# Patient Record
Sex: Female | Born: 1995 | Race: White | Hispanic: No | Marital: Single | Smoking: Never smoker
Health system: Southern US, Community
[De-identification: ages and names within clinical notes are randomized; demographics above are authoritative.]

## PROBLEM LIST (undated history)

## (undated) DIAGNOSIS — D649 Anemia, unspecified: Secondary | ICD-10-CM

## (undated) DIAGNOSIS — B019 Varicella without complication: Secondary | ICD-10-CM

## (undated) DIAGNOSIS — N946 Dysmenorrhea, unspecified: Secondary | ICD-10-CM

## (undated) DIAGNOSIS — N939 Abnormal uterine and vaginal bleeding, unspecified: Secondary | ICD-10-CM

## (undated) DIAGNOSIS — N390 Urinary tract infection, site not specified: Secondary | ICD-10-CM

## (undated) DIAGNOSIS — B029 Zoster without complications: Secondary | ICD-10-CM

## (undated) HISTORY — DX: Anemia, unspecified: D64.9

## (undated) HISTORY — DX: Dysmenorrhea, unspecified: N94.6

## (undated) HISTORY — DX: Urinary tract infection, site not specified: N39.0

## (undated) HISTORY — DX: Varicella without complication: B01.9

## (undated) HISTORY — DX: Abnormal uterine and vaginal bleeding, unspecified: N93.9

## (undated) HISTORY — DX: Zoster without complications: B02.9

---
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
## 2014-04-22 ENCOUNTER — Ambulatory Visit (INDEPENDENT_AMBULATORY_CARE_PROVIDER_SITE_OTHER): Payer: BC Managed Care – PPO | Admitting: Certified Nurse Midwife

## 2014-04-22 ENCOUNTER — Encounter: Payer: Self-pay | Admitting: Certified Nurse Midwife

## 2014-04-22 VITALS — BP 90/60 | HR 64 | Resp 16 | Ht 68.25 in | Wt 132.0 lb

## 2014-04-22 DIAGNOSIS — Z01419 Encounter for gynecological examination (general) (routine) without abnormal findings: Secondary | ICD-10-CM

## 2014-04-22 DIAGNOSIS — N925 Other specified irregular menstruation: Secondary | ICD-10-CM

## 2014-04-22 DIAGNOSIS — N938 Other specified abnormal uterine and vaginal bleeding: Secondary | ICD-10-CM

## 2014-04-22 DIAGNOSIS — Z0000 Encounter for general adult medical examination without abnormal findings: Secondary | ICD-10-CM

## 2014-04-22 DIAGNOSIS — N949 Unspecified condition associated with female genital organs and menstrual cycle: Secondary | ICD-10-CM

## 2014-04-22 LAB — TSH: TSH: 1.241 u[IU]/mL (ref 0.350–4.500)

## 2014-04-22 LAB — POCT URINALYSIS DIPSTICK
Bilirubin, UA: NEGATIVE
Blood, UA: NEGATIVE
Glucose, UA: NEGATIVE
Ketones, UA: NEGATIVE
Nitrite, UA: NEGATIVE
Protein, UA: NEGATIVE
Urobilinogen, UA: NEGATIVE
pH, UA: 5

## 2014-04-22 NOTE — Patient Instructions (Addendum)
General topics  Next pap or exam is  due in 1 year Take a Women's multivitamin Take 1200 mg. of calcium daily - prefer dietary If any concerns in interim to call back  Breast Self-Awareness Practicing breast self-awareness may pick up problems early, prevent significant medical complications, and possibly save your life. By practicing breast self-awareness, you can become familiar with how your breasts look and feel and if your breasts are changing. This allows you to notice changes early. It can also offer you some reassurance that your breast health is good. One way to learn what is normal for your breasts and whether your breasts are changing is to do a breast self-exam. If you find a lump or something that was not present in the past, it is best to contact your caregiver right away. Other findings that should be evaluated by your caregiver include nipple discharge, especially if it is bloody; skin changes or reddening; areas where the skin seems to be pulled in (retracted); or new lumps and bumps. Breast pain is seldom associated with cancer (malignancy), but should also be evaluated by a caregiver. BREAST SELF-EXAM The best time to examine your breasts is 5 7 days after your menstrual period is over.  ExitCare Patient Information 2013 [employer].   Exercise to Stay Healthy Exercise helps you become and stay healthy. EXERCISE IDEAS AND TIPS Choose exercises that:  You enjoy.  Fit into your day. You do not need to exercise really hard to be healthy. You can do exercises at a slow or medium level and stay healthy. You can:  Stretch before and after working out.  Try yoga, Pilates, or tai chi.  Lift weights.  Walk fast, swim, jog, run, climb stairs, bicycle, dance, or rollerskate.  Take aerobic classes. Exercises that burn about 150 calories:  Running 1  miles in 15 minutes.  Playing volleyball for 45 to 60 minutes.  Washing and waxing a car for 45 to 60  minutes.  Playing touch football for 45 minutes.  Walking 1  miles in 35 minutes.  Pushing a stroller 1  miles in 30 minutes.  Playing basketball for 30 minutes.  Raking leaves for 30 minutes.  Bicycling 5 miles in 30 minutes.  Walking 2 miles in 30 minutes.  Dancing for 30 minutes.  Shoveling snow for 15 minutes.  Swimming laps for 20 minutes.  Walking up stairs for 15 minutes.  Bicycling 4 miles in 15 minutes.  Gardening for 30 to 45 minutes.  Jumping rope for 15 minutes.  Washing windows or floors for 45 to 60 minutes. Document Released: 11/11/2010 Document Revised: 01/01/2012 Document Reviewed: 11/11/2010 Exit[employer] Patient Information 2013 [employer].   Other topics ( that may be useful information):    Sexually Transmitted Disease Sexually transmitted disease (STD) refers to any infection that is passed from person to person during sexual activity. This may happen by way of saliva, semen, blood, vaginal mucus, or urine. Common STDs include:  Gonorrhea.  Chlamydia.  Syphilis.  HIV/AIDS.  Genital herpes.  Hepatitis B and C.  Trichomonas.  Human papillomavirus (HPV).  Pubic lice. CAUSES  An STD may be spread by bacteria, virus, or parasite. A person can get an STD by:  Sexual intercourse with an infected person.  Sharing sex toys with an infected person.  Sharing needles with an infected person.  Having intimate contact with the genitals, mouth, or rectal areas of an infected person. SYMPTOMS  Some people may not have any symptoms, but   they can still pass the infection to others. Different STDs have different symptoms. Symptoms include:  Painful or bloody urination.  Pain in the pelvis, abdomen, vagina, anus, throat, or eyes.  Skin rash, itching, irritation, growths, or sores (lesions). These usually occur in the genital or anal area.  Abnormal vaginal discharge.  Penile discharge in men.  Soft, flesh-colored skin growths in the  genital or anal area.  Fever.  Pain or bleeding during sexual intercourse.  Swollen glands in the groin area.  Yellow skin and eyes (jaundice). This is seen with hepatitis. DIAGNOSIS  To make a diagnosis, your caregiver may:  Take a medical history.  Perform a physical exam.  Take a specimen (culture) to be examined.  Examine a sample of discharge under a microscope.  Perform blood test TREATMENT   Chlamydia, gonorrhea, trichomonas, and syphilis can be cured with antibiotic medicine.  Genital herpes, hepatitis, and HIV can be treated, but not cured, with prescribed medicines. The medicines will lessen the symptoms.  Genital warts from HPV can be treated with medicine or by freezing, burning (electrocautery), or surgery. Warts may come back.  HPV is a virus and cannot be cured with medicine or surgery.However, abnormal areas may be followed very closely by your caregiver and may be removed from the cervix, vagina, or vulva through office procedures or surgery. If your diagnosis is confirmed, your recent sexual partners need treatment. This is true even if they are symptom-free or have a negative culture or evaluation. They should not have sex until their caregiver says it is okay. HOME CARE INSTRUCTIONS  All sexual partners should be informed, tested, and treated for all STDs.  Take your antibiotics as directed. Finish them even if you start to feel better.  Only take over-the-counter or prescription medicines for pain, discomfort, or fever as directed by your caregiver.  Rest.  Eat a balanced diet and drink enough fluids to keep your urine clear or pale yellow.  Do not have sex until treatment is completed and you have followed up with your caregiver. STDs should be checked after treatment.  Keep all follow-up appointments, Pap tests, and blood tests as directed by your caregiver.  Only use latex condoms and water-soluble lubricants during sexual activity. Do not use  petroleum jelly or oils.  Avoid alcohol and illegal drugs.  Get vaccinated for HPV and hepatitis. If you have not received these vaccines in the past, talk to your caregiver about whether one or both might be right for you.  Avoid risky sex practices that can break the skin. The only way to avoid getting an STD is to avoid all sexual activity.Latex condoms and dental dams (for oral sex) will help lessen the risk of getting an STD, but will not completely eliminate the risk. SEEK MEDICAL CARE IF:   You have a fever.  You have any new or worsening symptoms. Document Released: 12/30/2002 Document Revised: 01/01/2012 Document Reviewed: 01/06/2011 [facility] Patient Information 2013 [place].    Domestic Abuse You are being battered or abused if someone close to you hits, pushes, or physically hurts you in any way. You also are being abused if you are forced into activities. You are being sexually abused if you are forced to have sexual contact of any kind. You are being emotionally abused if you are made to feel worthless or if you are constantly threatened. It is important to remember that help is available. No one has the right to abuse you. PREVENTION OF FURTHER  ABUSE  Learn the warning signs of danger. This varies with situations but may include: the use of alcohol, threats, isolation from friends and family, or forced sexual contact. Leave if you feel that violence is going to occur.  If you are attacked or beaten, report it to the police so the abuse is documented. You do not have to press charges. The police can protect you while you or the attackers are leaving. Get the officer's name and badge number and a copy of the report.  Find someone you can trust and tell them what is happening to you: your caregiver, a nurse, clergy member, close friend or family member. Feeling ashamed is natural, but remember that you have done nothing wrong. No one deserves abuse. Document Released:  10/06/2000 Document Revised: 01/01/2012 Document Reviewed: 12/15/2010 ExitCare Patient Information 2013 [employer].    How Much is Too Much Alcohol? Drinking too much alcohol can cause injury, accidents, and health problems. These types of problems can include:   Car crashes.  Falls.  Family fighting (domestic violence).  Drowning.  Fights.  Injuries.  Burns.  Damage to certain organs.  Having a baby with birth defects. ONE DRINK CAN BE TOO MUCH WHEN YOU ARE:  Working.  Pregnant or breastfeeding.  Taking medicines. Ask your doctor.  Driving or planning to drive. If you or someone you know has a drinking problem, get help from a doctor.  Document Released: 08/05/2009 Document Revised: 01/01/2012 Document Reviewed: 08/05/2009 ExitCare Patient Information 2013 [employer].   Smoking Hazards Smoking cigarettes is extremely bad for your health. Tobacco smoke has over 200 known poisons in it. There are over 60 chemicals in tobacco smoke that cause cancer. Some of the chemicals found in cigarette smoke include:   Cyanide.  Benzene.  Formaldehyde.  Methanol (wood alcohol).  Acetylene (fuel used in welding torches).  Ammonia. Cigarette smoke also contains the poisonous gases nitrogen oxide and carbon monoxide.  Cigarette smokers have an increased risk of many serious medical problems and Smoking causes approximately:  90% of all lung cancer deaths in men.  80% of all lung cancer deaths in women.  90% of deaths from chronic obstructive lung disease. Compared with nonsmokers, smoking increases the risk of:  Coronary heart disease by 2 to 4 times.  Stroke by 2 to 4 times.  Men developing lung cancer by 23 times.  Women developing lung cancer by 13 times.  Dying from chronic obstructive lung diseases by 12 times.  . Smoking is the most preventable cause of death and disease in our society.  WHY IS SMOKING ADDICTIVE?  Nicotine is the chemical  agent in tobacco that is capable of causing addiction or dependence.  When you smoke and inhale, nicotine is absorbed rapidly into the bloodstream through your lungs. Nicotine absorbed through the lungs is capable of creating a powerful addiction. Both inhaled and non-inhaled nicotine may be addictive.  Addiction studies of cigarettes and spit tobacco show that addiction to nicotine occurs mainly during the teen years, when young people begin using tobacco products. WHAT ARE THE BENEFITS OF QUITTING?  There are many health benefits to quitting smoking.   Likelihood of developing cancer and heart disease decreases. Health improvements are seen almost immediately.  Blood pressure, pulse rate, and breathing patterns start returning to normal soon after quitting. QUITTING SMOKING   American Lung Association - 1-800-LUNGUSA  American Cancer Society - 1-800-ACS-2345 Document Released: 11/16/2004 Document Revised: 01/01/2012 Document Reviewed: 07/21/2009 ExitCare Patient Information 2013 ExitCare[employer]   LL[employer].   Stress Management Stress is a state of physical or mental tension that often results from changes in your life or normal routine. Some common causes of stress are:  Death of a loved one.  Injuries or severe illnesses.  Getting fired or changing jobs.  Moving into a new home. Other causes may be:  Sexual problems.  Business or financial losses.  Taking on a large debt.  Regular conflict with someone at home or at work.  Constant tiredness from lack of sleep. It is not just bad things that are stressful. It may be stressful to:  Win the lottery.  Get married.  Buy a new car. The amount of stress that can be easily tolerated varies from person to person. Changes generally cause stress, regardless of the types of change. Too much stress can affect your health. It may lead to physical or emotional problems. Too little stress (boredom) may also become stressful. SUGGESTIONS TO  REDUCE STRESS:  Talk things over with your family and friends. It often is helpful to share your concerns and worries. If you feel your problem is serious, you may want to get help from a professional counselor.  Consider your problems one at a time instead of lumping them all together. Trying to take care of everything at once may seem impossible. List all the things you need to do and then start with the most important one. Set a goal to accomplish 2 or 3 things each day. If you expect to do too many in a single day you will naturally fail, causing you to feel even more stressed.  Do not use alcohol or drugs to relieve stress. Although you may feel better for a short time, they do not remove the problems that caused the stress. They can also be habit forming.  Exercise regularly - at least 3 times per week. Physical exercise can help to relieve that "uptight" feeling and will relax you.  The shortest distance between despair and hope is often a good night's sleep.  Go to bed and get up on time allowing yourself time for appointments without being rushed.  Take a short "time-out" period from any stressful situation that occurs during the day. Close your eyes and take some deep breaths. Starting with the muscles in your face, tense them, hold it for a few seconds, then relax. Repeat this with the muscles in your neck, shoulders, hand, stomach, back and legs.  Take good care of yourself. Eat a balanced diet and get plenty of rest.  Schedule time for having fun. Take a break from your daily routine to relax. HOME CARE INSTRUCTIONS   Call if you feel overwhelmed by your problems and feel you can no longer manage them on your own.  Return immediately if you feel like hurting yourself or someone else. Document Released: 04/04/2001 Document Revised: 01/01/2012 Document Reviewed: 11/25/2007 [facility] Patient Information 2013 [place].  Oral Contraception Information Oral contraceptive  pills (OCPs) are medicines taken to prevent pregnancy. OCPs work by preventing the ovaries from releasing eggs. The hormones in OCPs also cause the cervical mucus to thicken, preventing the sperm from entering the uterus. The hormones also cause the uterine lining to become thin, not allowing a fertilized egg to attach to the inside of the uterus. OCPs are highly effective when taken exactly as prescribed. However, OCPs do not prevent sexually transmitted diseases (STDs). Safe sex practices, such as using condoms along with the pill, can help prevent STDs.  Before taking the pill, you may have a physical exam and Pap test. Your health care provider may order blood tests. The health care provider will make sure you are a good candidate for oral contraception. Discuss with your health care provider the possible side effects of the OCP you may be prescribed. When starting an OCP, it can take 2 to 3 months for the body to adjust to the changes in hormone levels in your body.  TYPES OF ORAL CONTRACEPTION  The combination pill--This pill contains estrogen and progestin (synthetic progesterone) hormones. The combination pill comes in 21-day, 28-day, or 91-day packs. Some types of combination pills are meant to be taken continuously (365-day pills). With 21-day packs, you do not take pills for 7 days after the last pill. With 28-day packs, the pill is taken every day. The last 7 pills are without hormones. Certain types of pills have more than 21 hormone-containing pills. With 91-day packs, the first 84 pills contain both hormones, and the last 7 pills contain no hormones or contain estrogen only.  The minipill--This pill contains the progesterone hormone only. The pill is taken every day continuously. It is very important to take the pill at the same time each day. The minipill comes in packs of 28 pills. All 28 pills contain the hormone.  ADVANTAGES OF ORAL CONTRACEPTIVE PILLS  Decreases premenstrual symptoms.    Treats menstrual period cramps.   Regulates the menstrual cycle.   Decreases a heavy menstrual flow.   May treatacne, depending on the type of pill.   Treats abnormal uterine bleeding.   Treats polycystic ovarian syndrome.   Treats endometriosis.   Can be used as emergency contraception.  THINGS THAT CAN MAKE ORAL CONTRACEPTIVE PILLS LESS EFFECTIVE OCPs can be less effective if:   You forget to take the pill at the same time every day.   You have a stomach or intestinal disease that lessens the absorption of the pill.   You take OCPs with other medicines that make OCPs less effective, such as antibiotics, certain HIV medicines, and some seizure medicines.   You take expired OCPs.   You forget to restart the pill on day 7, when using the packs of 21 pills.  RISKS ASSOCIATED WITH ORAL CONTRACEPTIVE PILLS  Oral contraceptive pills can sometimes cause side effects, such as:  Headache.  Nausea.  Breast tenderness.  Irregular bleeding or spotting. Combination pills are also associated with a small increased risk of:  Blood clots.  Heart attack.  Stroke. Document Released: 12/30/2002 Document Revised: 07/30/2013 Document Reviewed: 03/30/2013 [facility] Patient Information 2015 [place], [place]. This information is not intended to replace advice given to you by your health care provider. Make sure you discuss any questions you have with your health care provider.   Sexually Transmitted Disease A sexually transmitted disease (STD) is a disease or infection that may be passed (transmitted) from person to person, usually during sexual activity. This may happen by way of saliva, semen, blood, vaginal mucus, or urine. Common STDs include:   Gonorrhea.   Chlamydia.   Syphilis.   HIV and AIDS.   Genital herpes.   Hepatitis B and C.   Trichomonas.   Human papillomavirus (HPV).   Pubic lice.   Scabies.  Mites.  Bacterial  vaginosis. WHAT ARE CAUSES OF STDs? An STD may be caused by bacteria, a virus, or parasites. STDs are often transmitted during sexual activity if one person is infected. However, they may also be transmitted through nonsexual  means. STDs may be transmitted after:   Sexual intercourse with an infected person.   Sharing sex toys with an infected person.   Sharing needles with an infected person or using unclean piercing or tattoo needles.  Having intimate contact with the genitals, mouth, or rectal areas of an infected person.   Exposure to infected fluids during birth. WHAT ARE THE SIGNS AND SYMPTOMS OF STDs? Different STDs have different symptoms. Some people may not have any symptoms. If symptoms are present, they may include:   Painful or bloody urination.   Pain in the pelvis, abdomen, vagina, anus, throat, or eyes.   A skin rash, itching, or irritation.  Growths, ulcerations, blisters, or sores in the genital and anal areas.  Abnormal vaginal discharge with or without bad odor.   Penile discharge in men.   Fever.   Pain or bleeding during sexual intercourse.   Swollen glands in the groin area.   Yellow skin and eyes (jaundice). This is seen with hepatitis.   Swollen testicles.  Infertility.  Sores and blisters in the mouth. HOW ARE STDs DIAGNOSED? To make a diagnosis, your health care provider may:   Take a medical history.   Perform a physical exam.   Take a sample of any discharge to examine.  Swab the throat, cervix, opening to the penis, rectum, or vagina for testing.  Test a sample of your first morning urine.   Perform blood tests.   Perform a Pap test, if this applies.   Perform a colposcopy.   Perform a laparoscopy.  HOW ARE STDs TREATED? Treatment depends on the STD. Some STDs may be treated but not cured.   Chlamydia, gonorrhea, trichomonas, and syphilis can be cured with antibiotic medicine.   Genital herpes,  hepatitis, and HIV can be treated, but not cured, with prescribed medicines. The medicines lessen symptoms.   Genital warts from HPV can be treated with medicine or by freezing, burning (electrocautery), or surgery. Warts may come back.   HPV cannot be cured with medicine or surgery. However, abnormal areas may be removed from the cervix, vagina, or vulva.   If your diagnosis is confirmed, your recent sexual partners need treatment. This is true even if they are symptom-free or have a negative culture or evaluation. They should not have sex until their health care providers say it is okay. HOW CAN I REDUCE MY RISK OF GETTING AN STD? Take these steps to reduce your risk of getting an STD:  Use latex condoms, dental dams, and water-soluble lubricants during sexual activity. Do not use petroleum jelly or oils.  Avoid having multiple sex partners.  Do not have sex with someone who has other sex partners.  Do not have sex with anyone you do not know or who is at high risk for an STD.  Avoid risky sex practices that can break your skin.  Do not have sex if you have open sores on your mouth or skin.  Avoid drinking too much alcohol or taking illegal drugs. Alcohol and drugs can affect your judgment and put you in a vulnerable position.  Avoid engaging in oral and anal sex acts.  Get vaccinated for HPV and hepatitis. If you have not received these vaccines in the past, talk to your health care provider about whether one or both might be right for you.   If you are at risk of being infected with HIV, it is recommended that you take a prescription medicine daily to prevent HIV infection.  This is called pre-exposure prophylaxis (PrEP). You are considered at risk if:  You are a man who has sex with other men (MSM).  You are a heterosexual man or woman and are sexually active with more than one partner.  You take drugs by injection.  You are sexually active with a partner who has  HIV.  Talk with your health care provider about whether you are at high risk of being infected with HIV. If you choose to begin PrEP, you should first be tested for HIV. You should then be tested every 3 months for as long as you are taking PrEP.  WHAT SHOULD I DO IF I THINK I HAVE AN STD?  See your health care provider.   Tell your sexual partner(s). They should be tested and treated for any STDs.  Do not have sex until your health care provider says it is okay. WHEN SHOULD I GET IMMEDIATE MEDICAL CARE? Contact your health care provider right away if:   You have severe abdominal pain.  You are a man and notice swelling or pain in your testicles.  You are a woman and notice swelling or pain in your vagina. Document Released: 12/30/2002 Document Revised: 10/14/2013 Document Reviewed: 04/29/2013 [facility] Patient Information 2015 [place], [place]. This information is not intended to replace advice given to you by your health care provider. Make sure you discuss any questions you have with your health care provider.  Great to meet you today. Let me know if you have any other questions. Debbi

## 2014-04-22 NOTE — Progress Notes (Signed)
18 y.o. G0P0000 Single Caucasian Fe here to establish gyn care and  for annual exam. Patient is here with problem of short cycles which are heavy. Patient being treated by PCP for anemia, last Hgb was 9.0 per patient and mom. Cycles are usually every 15 to 21 days and heavy 4-5 duration, which require tampon and pads at times. Mother with patient at patient's request for conversation only. Patient desires privacy for exam. Patient has never had Gyn exam and denies being sexually active ever. Denies vaginal symptoms or other problems.  No other health issues today.  Mother was tested for Factor V Leiden and was negative.  Mother has been on pills in the past and did not have any trouble with them.  Not on them now.   Patient's last menstrual period was 04/02/2014.          Sexually active: No.  The current method of family planning is abstinence.    Exercising: Yes.    swimming & running Smoker:  no  Health Maintenance: Pap:  none MMG:  none Colonoscopy:  none BMD:   none TDaP: 2014 Labs: Poct urine-tr wbc Self breast exam: not done   reports that she has never smoked. She does not have any smokeless tobacco history on file. She reports that she drinks about 1.5 ounces of alcohol per week. She reports that she does not use illicit drugs.  Past Medical History  Diagnosis Date  . Abnormal uterine bleeding     frequent cycles  . Anemia   . Dysmenorrhea   . Chicken pox     born with chicken pox  . Shingles     age 89  . Chronic UTI     History reviewed. No pertinent past surgical history.  Current Outpatient Prescriptions  Medication Sig Dispense Refill  . ferrous sulfate 325 (65 FE) MG EC tablet Take 325 mg by mouth 3 (three) times daily with meals.       No current facility-administered medications for this visit.    Family History  Problem Relation Age of Onset  . Hypertension Father   . Hyperlipidemia Father   . Hypertension Maternal Grandmother   . Cancer Maternal  Grandfather     prostate  . Hypertension Paternal Grandmother   . Hypertension Paternal Grandfather     ROS:  Pertinent items are noted in HPI.  Otherwise, a comprehensive ROS was negative.  Exam:   BP 90/60  Pulse 64  Resp 16  Ht 5' 8.25" (1.734 m)  Wt 132 lb (59.875 kg)  BMI 19.91 kg/m2  LMP 04/02/2014 Height: 5' 8.25" (173.4 cm)  Ht Readings from Last 3 Encounters:  04/22/14 5' 8.25" (1.734 m) (94%*, Z = 1.56)   * Growth percentiles are based on CDC 2-20 Years data.    General appearance: alert, cooperative and appears stated age Head: Normocephalic, without obvious abnormality, atraumatic Neck: no adenopathy, supple, symmetrical, trachea midline and thyroid normal to inspection and palpation and non-palpable Lungs: clear to auscultation bilaterally Breasts: normal appearance, no masses or tenderness, No nipple retraction or dimpling, No nipple discharge or bleeding, No axillary or supraclavicular adenopathy Heart: regular rate and rhythm Abdomen: soft, non-tender; no masses,  no organomegaly Extremities: extremities normal, atraumatic, no cyanosis or edema Skin: Skin color, texture, turgor normal. No rashes or lesions Lymph nodes: Cervical, supraclavicular, and axillary nodes normal. No abnormal inguinal nodes palpated Neurologic: Grossly normal   Pelvic: External genitalia:  no lesions  Urethra:  normal appearing urethra with no masses, tenderness or lesions              Bartholin's and Skene's: normal                 Vagina: normal appearing vagina with normal color and discharge, no lesions              Cervix: normal, non tender              Pap taken: No. Bimanual Exam:  Uterus:  normal size, contour, position, consistency, mobility, non-tender and anteverted              Adnexa: normal adnexa and no mass, fullness, tenderness               Rectovaginal: Confirms               Anus:   Deferred  A:  Well Woman with normal exam  History of  Dysmenorrhea with menorrhagia and subsequent anemia desires cycle control with OCP  Anemia under PCP management, on Rx  P:   Reviewed health and wellness pertinent to exam  Discussed risks, benefits of OCP, expectations with use, bleeding profile and consistency of use for best outcome. Also discussed thyroid role in cycle changes, patient's MGM had some issue with OCP years ago with numbness, unknown etiology. Will await labs and then decide if patient would like to start on OCP. Mother present during this part of the conversation,per patient request.  Labs: TSH, Factor 5 Leiden  Pap smear not taken today  counseled on breast self exam, STD prevention, HIV risk factors and prevention, adequate intake of calcium and vitamin D, diet and exercise   Rv as above return annually or prn  An After Visit Summary was printed and given to the patient.

## 2014-04-25 LAB — FACTOR 5 LEIDEN

## 2014-04-30 ENCOUNTER — Telehealth: Payer: Self-pay | Admitting: Certified Nurse Midwife

## 2014-04-30 NOTE — Telephone Encounter (Signed)
Routing to Verner Choleborah S. Leonard CNM for review and advice re Ferrous Sulfate rx and new start ocp.

## 2014-04-30 NOTE — Telephone Encounter (Signed)
This is appropriate response to iron supplement. If patient wanted to start OCP she can. This should not be a problem.

## 2014-04-30 NOTE — Telephone Encounter (Signed)
Patient's mom calling on behalf of patient with HGB levels from Dr. Duanne Guessewey: 03/26/14=9, 04/16/14=10.6. She wants to know if this is good enough progress on the iron supplement she is taking 3 x daily. Also, mom is inquiring about how this will effect the decision about birth control?

## 2014-05-01 ENCOUNTER — Telehealth: Payer: Self-pay | Admitting: Certified Nurse Midwife

## 2014-05-01 MED ORDER — NORETHIN-ETH ESTRAD-FE BIPHAS 1 MG-10 MCG / 10 MCG PO TABS: 1.0000 | ORAL_TABLET | Freq: Every day | ORAL | Status: DC

## 2014-05-01 NOTE — Telephone Encounter (Signed)
Routed to provider for review, encounter closed.  

## 2014-05-01 NOTE — Telephone Encounter (Signed)
Spoke with patient's mother Judy Carter. Okay per ROI. She states that patient would like to start birth control now. Aware to start pack with the first day of menses. Requesting rx [facility] [address]. Advised would send a message to Verner Choleborah S. Leonard CNM and let her know and that we would get rx sent in today for patient. Mother is agreeable. Would like rx sent for 90 days as patient will be returning to college soon and this will be easier. Three month recheck scheduled for Oct 2nd at 1:15pm with Verner Choleborah S. Leonard CNM. Agreeable to date and time.  Verner Choleborah S. Leonard CNM what kind of OCP do you recommend for this patient to start on? I will be happy to place order. Thank you.

## 2014-05-01 NOTE — Telephone Encounter (Signed)
Order placed for Loloestrin Fe #3 with 3RF to pharmacy of choice.  Routing to provider for final review. Patient agreeable to disposition. Will close encounter ;

## 2014-05-01 NOTE — Telephone Encounter (Signed)
05/01/14 #3 packs with 3 refills was sent to [facility]  S/w patient and notified her that rx has been sent.

## 2014-05-01 NOTE — Telephone Encounter (Signed)
Loloestrin Fe is what we discussed.

## 2014-05-01 NOTE — Telephone Encounter (Signed)
Pt need a Rx for loloestrin birth control [facility] to [facility] at [phone].

## 2014-05-04 NOTE — Progress Notes (Signed)
Reviewed personally.  M. Suzanne Oriel Ojo, MD.  

## 2014-05-04 NOTE — Progress Notes (Signed)
Reviewed personally.  M. Suzanne Roise Emert, MD.  

## 2014-07-24 ENCOUNTER — Ambulatory Visit (INDEPENDENT_AMBULATORY_CARE_PROVIDER_SITE_OTHER): Payer: BC Managed Care – PPO | Admitting: Certified Nurse Midwife

## 2014-07-24 ENCOUNTER — Encounter: Payer: Self-pay | Admitting: Certified Nurse Midwife

## 2014-07-24 VITALS — BP 90/60 | HR 64 | Resp 16 | Ht 68.25 in | Wt 136.0 lb

## 2014-07-24 DIAGNOSIS — Z3041 Encounter for surveillance of contraceptive pills: Secondary | ICD-10-CM

## 2014-07-24 DIAGNOSIS — N920 Excessive and frequent menstruation with regular cycle: Secondary | ICD-10-CM

## 2014-07-24 MED ORDER — NORETHIN-ETH ESTRAD-FE BIPHAS 1 MG-10 MCG / 10 MCG PO TABS: 1.0000 | ORAL_TABLET | Freq: Every day | ORAL | Status: DC

## 2014-07-24 NOTE — Patient Instructions (Signed)

## 2014-07-24 NOTE — Progress Notes (Signed)
18 y.o. Single Caucasian G0P0000here for evaluation of contraception  initiated on 04/22/14 for menorrhagia resulting in anemia. Menses duration 4 days with light flow. Patient taking medication as prescribed. Denies missed pills, headaches, nausea, DVT warning signs or symptoms,  breakthrough bleeding, or other changes.   Keeping menses calendar. Patient has had repeat CBC with Hgb. 12.2. Continues on OTC iron per PCP. "So happy with my periods now". Freshmen at [employer] doing well so far.  No other health issues today  O: Healthy female, WD WN Affect: normal orientation X 3    A: History of menorrhagia with anemia,  Loloestrin Fe working well.  P: Continue Loloestrin Fe as prescribed. Notify if cycles become heavy again or warning signs develop.  Rv aex, prn  20 minutes spent with patient  in face to face counseling regarding contraception surveillance.  RV prn, aex

## 2014-07-26 NOTE — Progress Notes (Signed)
Encounter reviewed by Dr. Grantham Hippert Silva.  

## 2015-04-29 ENCOUNTER — Encounter: Payer: Self-pay | Admitting: Certified Nurse Midwife

## 2015-04-29 ENCOUNTER — Ambulatory Visit (INDEPENDENT_AMBULATORY_CARE_PROVIDER_SITE_OTHER): Payer: BLUE CROSS/BLUE SHIELD | Admitting: Certified Nurse Midwife

## 2015-04-29 VITALS — BP 98/62 | HR 68 | Resp 16 | Ht 68.25 in | Wt 141.0 lb

## 2015-04-29 DIAGNOSIS — Z01419 Encounter for gynecological examination (general) (routine) without abnormal findings: Secondary | ICD-10-CM | POA: Diagnosis not present

## 2015-04-29 DIAGNOSIS — Z0000 Encounter for general adult medical examination without abnormal findings: Secondary | ICD-10-CM | POA: Diagnosis not present

## 2015-04-29 DIAGNOSIS — Z3041 Encounter for surveillance of contraceptive pills: Secondary | ICD-10-CM | POA: Diagnosis not present

## 2015-04-29 LAB — POCT URINALYSIS DIPSTICK
BILIRUBIN UA: NEGATIVE
Blood, UA: NEGATIVE
Glucose, UA: NEGATIVE
Ketones, UA: NEGATIVE
Leukocytes, UA: NEGATIVE
NITRITE UA: NEGATIVE
PH UA: 5
Protein, UA: NEGATIVE
UROBILINOGEN UA: NEGATIVE

## 2015-04-29 MED ORDER — NORETHIN-ETH ESTRAD-FE BIPHAS 1 MG-10 MCG / 10 MCG PO TABS: 1.0000 | ORAL_TABLET | Freq: Every day | ORAL | Status: DC

## 2015-04-29 NOTE — Progress Notes (Signed)
19 y.o. G0P0000 Single  Caucasian Fe here for annual exam.  Periods normal, no issues. Contraception OCP. Sexually active, no partner change. No STD screening. Just finished study abroad in [place]. Sees PCP prn. No health issues today.  Patient's last menstrual period was 04/16/2015.          Sexually active: Yes.    The current method of family planning is OCP (estrogen/progesterone).    Exercising: Yes.    running,cardio & abs Smoker:  no  Health Maintenance: Pap:  none MMG:  none Colonoscopy:  none BMD:   none TDaP:  2014 Labs: Poct urine-neg Self breast exam: not done   reports that she has never smoked. She has never used smokeless tobacco. She reports that she drinks about 2.4 - 3.0 oz of alcohol per week. She reports that she does not use illicit drugs.  Past Medical History  Diagnosis Date  . Abnormal uterine bleeding     frequent cycles  . Anemia   . Dysmenorrhea   . Chicken pox     born with chicken pox  . Shingles     age 89  . Chronic UTI     History reviewed. No pertinent past surgical history.  Current Outpatient Prescriptions  Medication Sig Dispense Refill  . Norethindrone-Ethinyl Estradiol-Fe Biphas (LO LOESTRIN FE) 1 MG-10 MCG / 10 MCG tablet Take 1 tablet by mouth daily. 3 Package 3   No current facility-administered medications for this visit.    Family History  Problem Relation Age of Onset  . Hypertension Father   . Hyperlipidemia Father   . Hypertension Maternal Grandmother   . Cancer Maternal Grandfather     prostate  . Hypertension Paternal Grandmother   . Hypertension Paternal Grandfather     ROS:  Pertinent items are noted in HPI.  Otherwise, a comprehensive ROS was negative.  Exam:   BP 98/62 mmHg  Pulse 68  Resp 16  Ht 5' 8.25" (1.734 m)  Wt 141 lb (63.957 kg)  BMI 21.27 kg/m2  LMP 04/16/2015 Height: 5' 8.25" (173.4 cm) Ht Readings from Last 3 Encounters:  04/29/15 5' 8.25" (1.734 m)  07/24/14 5' 8.25" (1.734 m) (94 %*, Z =  1.55)  04/22/14 5' 8.25" (1.734 m) (94 %*, Z = 1.56)   * Growth percentiles are based on CDC 2-20 Years data.    General appearance: alert, cooperative and appears stated age Head: Normocephalic, without obvious abnormality, atraumatic Neck: no adenopathy, supple, symmetrical, trachea midline and thyroid normal to inspection and palpation Lungs: clear to auscultation bilaterally Breasts: normal appearance, no masses or tenderness, No nipple retraction or dimpling, No nipple discharge or bleeding, No axillary or supraclavicular adenopathy Heart: regular rate and rhythm Abdomen: soft, non-tender; no masses,  no organomegaly Extremities: extremities normal, atraumatic, no cyanosis or edema Skin: Skin color, texture, turgor normal. No rashes or lesions Lymph nodes: Cervical, supraclavicular, and axillary nodes normal. No abnormal inguinal nodes palpated Neurologic: Grossly normal   Pelvic: External genitalia:  no lesions              Urethra:  normal appearing urethra with no masses, tenderness or lesions              Bartholin's and Skene's: normal                 Vagina: normal appearing vagina with normal color and discharge, no lesions              Cervix:  normal,  non tender, no lesions              Pap taken: No. Bimanual Exam:  Uterus:  normal size, contour, position, consistency, mobility, non-tender              Adnexa: normal adnexa and no mass, fullness, tenderness               Rectovaginal: Confirms               Anus:  normal   Chaperone present: Yes  A:  Well Woman with normal exam  Contraception OCP desired  P:   Reviewed health and wellness pertinent to exam  Rx Lo loestrin  Order in  Pap smear not taken   counseled on breast self exam, STD prevention, HIV risk factors and prevention, adequate intake of calcium and vitamin D, diet and exercise  return annually or prn  An After Visit Summary was printed and given to the patient.

## 2015-04-29 NOTE — Progress Notes (Signed)
Reviewed personally.  M. Suzanne Asuncion Shibata, MD.  

## 2015-04-29 NOTE — Patient Instructions (Signed)
General topics  Next pap or exam is  due in 1 year Take a Women's multivitamin Take 1200 mg. of calcium daily - prefer dietary If any concerns in interim to call back  Breast Self-Awareness Practicing breast self-awareness may pick up problems early, prevent significant medical complications, and possibly save your life. By practicing breast self-awareness, you can become familiar with how your breasts look and feel and if your breasts are changing. This allows you to notice changes early. It can also offer you some reassurance that your breast health is good. One way to learn what is normal for your breasts and whether your breasts are changing is to do a breast self-exam. If you find a lump or something that was not present in the past, it is best to contact your caregiver right away. Other findings that should be evaluated by your caregiver include nipple discharge, especially if it is bloody; skin changes or reddening; areas where the skin seems to be pulled in (retracted); or new lumps and bumps. Breast pain is seldom associated with cancer (malignancy), but should also be evaluated by a caregiver. BREAST SELF-EXAM The best time to examine your breasts is 5 7 days after your menstrual period is over.  ExitCare Patient Information 2013 [employer].   Exercise to Stay Healthy Exercise helps you become and stay healthy. EXERCISE IDEAS AND TIPS Choose exercises that:  You enjoy.  Fit into your day. You do not need to exercise really hard to be healthy. You can do exercises at a slow or medium level and stay healthy. You can:  Stretch before and after working out.  Try yoga, Pilates, or tai chi.  Lift weights.  Walk fast, swim, jog, run, climb stairs, bicycle, dance, or rollerskate.  Take aerobic classes. Exercises that burn about 150 calories:  Running 1  miles in 15 minutes.  Playing volleyball for 45 to 60 minutes.  Washing and waxing a car for 45 to 60  minutes.  Playing touch football for 45 minutes.  Walking 1  miles in 35 minutes.  Pushing a stroller 1  miles in 30 minutes.  Playing basketball for 30 minutes.  Raking leaves for 30 minutes.  Bicycling 5 miles in 30 minutes.  Walking 2 miles in 30 minutes.  Dancing for 30 minutes.  Shoveling snow for 15 minutes.  Swimming laps for 20 minutes.  Walking up stairs for 15 minutes.  Bicycling 4 miles in 15 minutes.  Gardening for 30 to 45 minutes.  Jumping rope for 15 minutes.  Washing windows or floors for 45 to 60 minutes. Document Released: 11/11/2010 Document Revised: 01/01/2012 Document Reviewed: 11/11/2010 ExitCare Patient Information 2013 [employer].   Other topics ( that may be useful information):    Sexually Transmitted Disease Sexually transmitted disease (STD) refers to any infection that is passed from person to person during sexual activity. This may happen by way of saliva, semen, blood, vaginal mucus, or urine. Common STDs include:  Gonorrhea.  Chlamydia.  Syphilis.  HIV/AIDS.  Genital herpes.  Hepatitis B and C.  Trichomonas.  Human papillomavirus (HPV).  Pubic lice. CAUSES  An STD may be spread by bacteria, virus, or parasite. A person can get an STD by:  Sexual intercourse with an infected person.  Sharing sex toys with an infected person.  Sharing needles with an infected person.  Having intimate contact with the genitals, mouth, or rectal areas of an infected person. SYMPTOMS  Some people may not have any symptoms, but   they can still pass the infection to others. Different STDs have different symptoms. Symptoms include:  Painful or bloody urination.  Pain in the pelvis, abdomen, vagina, anus, throat, or eyes.  Skin rash, itching, irritation, growths, or sores (lesions). These usually occur in the genital or anal area.  Abnormal vaginal discharge.  Penile discharge in men.  Soft, flesh-colored skin growths in the  genital or anal area.  Fever.  Pain or bleeding during sexual intercourse.  Swollen glands in the groin area.  Yellow skin and eyes (jaundice). This is seen with hepatitis. DIAGNOSIS  To make a diagnosis, your caregiver may:  Take a medical history.  Perform a physical exam.  Take a specimen (culture) to be examined.  Examine a sample of discharge under a microscope.  Perform blood test TREATMENT   Chlamydia, gonorrhea, trichomonas, and syphilis can be cured with antibiotic medicine.  Genital herpes, hepatitis, and HIV can be treated, but not cured, with prescribed medicines. The medicines will lessen the symptoms.  Genital warts from HPV can be treated with medicine or by freezing, burning (electrocautery), or surgery. Warts may come back.  HPV is a virus and cannot be cured with medicine or surgery.However, abnormal areas may be followed very closely by your caregiver and may be removed from the cervix, vagina, or vulva through office procedures or surgery. If your diagnosis is confirmed, your recent sexual partners need treatment. This is true even if they are symptom-free or have a negative culture or evaluation. They should not have sex until their caregiver says it is okay. HOME CARE INSTRUCTIONS  All sexual partners should be informed, tested, and treated for all STDs.  Take your antibiotics as directed. Finish them even if you start to feel better.  Only take over-the-counter or prescription medicines for pain, discomfort, or fever as directed by your caregiver.  Rest.  Eat a balanced diet and drink enough fluids to keep your urine clear or pale yellow.  Do not have sex until treatment is completed and you have followed up with your caregiver. STDs should be checked after treatment.  Keep all follow-up appointments, Pap tests, and blood tests as directed by your caregiver.  Only use latex condoms and water-soluble lubricants during sexual activity. Do not use  petroleum jelly or oils.  Avoid alcohol and illegal drugs.  Get vaccinated for HPV and hepatitis. If you have not received these vaccines in the past, talk to your caregiver about whether one or both might be right for you.  Avoid risky sex practices that can break the skin. The only way to avoid getting an STD is to avoid all sexual activity.Latex condoms and dental dams (for oral sex) will help lessen the risk of getting an STD, but will not completely eliminate the risk. SEEK MEDICAL CARE IF:   You have a fever.  You have any new or worsening symptoms. Document Released: 12/30/2002 Document Revised: 01/01/2012 Document Reviewed: 01/06/2011 [facility] Patient Information 2013 [employer].    Domestic Abuse You are being battered or abused if someone close to you hits, pushes, or physically hurts you in any way. You also are being abused if you are forced into activities. You are being sexually abused if you are forced to have sexual contact of any kind. You are being emotionally abused if you are made to feel worthless or if you are constantly threatened. It is important to remember that help is available. No one has the right to abuse you. PREVENTION OF FURTHER  ABUSE  Learn the warning signs of danger. This varies with situations but may include: the use of alcohol, threats, isolation from friends and family, or forced sexual contact. Leave if you feel that violence is going to occur.  If you are attacked or beaten, report it to the police so the abuse is documented. You do not have to press charges. The police can protect you while you or the attackers are leaving. Get the officer's name and badge number and a copy of the report.  Find someone you can trust and tell them what is happening to you: your caregiver, a nurse, clergy member, close friend or family member. Feeling ashamed is natural, but remember that you have done nothing wrong. No one deserves abuse. Document Released:  10/06/2000 Document Revised: 01/01/2012 Document Reviewed: 12/15/2010 ExitCare Patient Information 2013 [employer].    How Much is Too Much Alcohol? Drinking too much alcohol can cause injury, accidents, and health problems. These types of problems can include:   Car crashes.  Falls.  Family fighting (domestic violence).  Drowning.  Fights.  Injuries.  Burns.  Damage to certain organs.  Having a baby with birth defects. ONE DRINK CAN BE TOO MUCH WHEN YOU ARE:  Working.  Pregnant or breastfeeding.  Taking medicines. Ask your doctor.  Driving or planning to drive. If you or someone you know has a drinking problem, get help from a doctor.  Document Released: 08/05/2009 Document Revised: 01/01/2012 Document Reviewed: 08/05/2009 ExitCare Patient Information 2013 [employer].   Smoking Hazards Smoking cigarettes is extremely bad for your health. Tobacco smoke has over 200 known poisons in it. There are over 60 chemicals in tobacco smoke that cause cancer. Some of the chemicals found in cigarette smoke include:   Cyanide.  Benzene.  Formaldehyde.  Methanol (wood alcohol).  Acetylene (fuel used in welding torches).  Ammonia. Cigarette smoke also contains the poisonous gases nitrogen oxide and carbon monoxide.  Cigarette smokers have an increased risk of many serious medical problems and Smoking causes approximately:  90% of all lung cancer deaths in men.  80% of all lung cancer deaths in women.  90% of deaths from chronic obstructive lung disease. Compared with nonsmokers, smoking increases the risk of:  Coronary heart disease by 2 to 4 times.  Stroke by 2 to 4 times.  Men developing lung cancer by 23 times.  Women developing lung cancer by 13 times.  Dying from chronic obstructive lung diseases by 12 times.  . Smoking is the most preventable cause of death and disease in our society.  WHY IS SMOKING ADDICTIVE?  Nicotine is the chemical  agent in tobacco that is capable of causing addiction or dependence.  When you smoke and inhale, nicotine is absorbed rapidly into the bloodstream through your lungs. Nicotine absorbed through the lungs is capable of creating a powerful addiction. Both inhaled and non-inhaled nicotine may be addictive.  Addiction studies of cigarettes and spit tobacco show that addiction to nicotine occurs mainly during the teen years, when young people begin using tobacco products. WHAT ARE THE BENEFITS OF QUITTING?  There are many health benefits to quitting smoking.   Likelihood of developing cancer and heart disease decreases. Health improvements are seen almost immediately.  Blood pressure, pulse rate, and breathing patterns start returning to normal soon after quitting. QUITTING SMOKING   American Lung Association - 1-800-LUNGUSA  American Cancer Society - 1-800-ACS-2345 Document Released: 11/16/2004 Document Revised: 01/01/2012 Document Reviewed: 07/21/2009 ExitCare Patient Information 2013 [employer].   Stress Management Stress is a state of physical or mental tension that often results from changes in your life or normal routine. Some common causes of stress are:  Death of a loved one.  Injuries or severe illnesses.  Getting fired or changing jobs.  Moving into a new home. Other causes may be:  Sexual problems.  Business or financial losses.  Taking on a large debt.  Regular conflict with someone at home or at work.  Constant tiredness from lack of sleep. It is not just bad things that are stressful. It may be stressful to:  Win the lottery.  Get married.  Buy a new car. The amount of stress that can be easily tolerated varies from person to person. Changes generally cause stress, regardless of the types of change. Too much stress can affect your health. It may lead to physical or emotional problems. Too little stress (boredom) may also become stressful. SUGGESTIONS TO  REDUCE STRESS:  Talk things over with your family and friends. It often is helpful to share your concerns and worries. If you feel your problem is serious, you may want to get help from a professional counselor.  Consider your problems one at a time instead of lumping them all together. Trying to take care of everything at once may seem impossible. List all the things you need to do and then start with the most important one. Set a goal to accomplish 2 or 3 things each day. If you expect to do too many in a single day you will naturally fail, causing you to feel even more stressed.  Do not use alcohol or drugs to relieve stress. Although you may feel better for a short time, they do not remove the problems that caused the stress. They can also be habit forming.  Exercise regularly - at least 3 times per week. Physical exercise can help to relieve that "uptight" feeling and will relax you.  The shortest distance between despair and hope is often a good night's sleep.  Go to bed and get up on time allowing yourself time for appointments without being rushed.  Take a short "time-out" period from any stressful situation that occurs during the day. Close your eyes and take some deep breaths. Starting with the muscles in your face, tense them, hold it for a few seconds, then relax. Repeat this with the muscles in your neck, shoulders, hand, stomach, back and legs.  Take good care of yourself. Eat a balanced diet and get plenty of rest.  Schedule time for having fun. Take a break from your daily routine to relax. HOME CARE INSTRUCTIONS   Call if you feel overwhelmed by your problems and feel you can no longer manage them on your own.  Return immediately if you feel like hurting yourself or someone else. Document Released: 04/04/2001 Document Revised: 01/01/2012 Document Reviewed: 11/25/2007 ExitCare Patient Information 2013 [employer].   

## 2016-03-09 ENCOUNTER — Encounter: Payer: Self-pay | Admitting: Certified Nurse Midwife

## 2016-03-09 ENCOUNTER — Ambulatory Visit (INDEPENDENT_AMBULATORY_CARE_PROVIDER_SITE_OTHER): Payer: BLUE CROSS/BLUE SHIELD | Admitting: Certified Nurse Midwife

## 2016-03-09 VITALS — BP 100/68 | HR 68 | Resp 16 | Ht 68.25 in | Wt 137.0 lb

## 2016-03-09 DIAGNOSIS — Z0000 Encounter for general adult medical examination without abnormal findings: Secondary | ICD-10-CM | POA: Diagnosis not present

## 2016-03-09 DIAGNOSIS — Z01419 Encounter for gynecological examination (general) (routine) without abnormal findings: Secondary | ICD-10-CM | POA: Diagnosis not present

## 2016-03-09 DIAGNOSIS — Z3041 Encounter for surveillance of contraceptive pills: Secondary | ICD-10-CM

## 2016-03-09 DIAGNOSIS — N390 Urinary tract infection, site not specified: Secondary | ICD-10-CM

## 2016-03-09 DIAGNOSIS — Z124 Encounter for screening for malignant neoplasm of cervix: Secondary | ICD-10-CM

## 2016-03-09 LAB — POCT URINALYSIS DIPSTICK
Bilirubin, UA: NEGATIVE
Glucose, UA: NEGATIVE
Ketones, UA: NEGATIVE
Leukocytes, UA: NEGATIVE
Nitrite, UA: POSITIVE
PH UA: 5
PROTEIN UA: NEGATIVE
UROBILINOGEN UA: NEGATIVE

## 2016-03-09 MED ORDER — NORETHIN-ETH ESTRAD-FE BIPHAS 1 MG-10 MCG / 10 MCG PO TABS: 1.0000 | ORAL_TABLET | Freq: Every day | ORAL | Status: DC

## 2016-03-09 NOTE — Progress Notes (Signed)
20 y.o. G0P0000 Single  Caucasian Fe here for annual exam.Periods scant to none with OCP use. Partner change no concerns, desires STD screening. 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 Denies any urinary urgency, slight frequency, no dysuria or back pain or fever. Patient does wear thongs frequently. Has history of UTI's. Sees PCP Dr. Duanne Guess as needed. No other health issues today. 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  No LMP recorded. Patient is not currently having periods (Reason: Oral contraceptives).          Sexually active: Yes.    The current method of family planning is OCP (estrogen/progesterone).    Exercising: Yes.    cardio Smoker:  no  Health Maintenance: Pap:  none MMG:  none Colonoscopy:  none BMD:   none TDaP:  2014 Shingles: no Pneumonia: no Hep C and HIV: not done Labs: poct urine-nitirite positive rbc tr, hgb- 14.7 Self breast exam: done occ   reports that she has never smoked. She has never used smokeless tobacco. She reports that she drinks about 1.2 - 1.8 oz of alcohol per week. She reports that she does not use illicit drugs.  Past Medical History  Diagnosis Date  . Abnormal uterine bleeding     frequent cycles  . Anemia   . Dysmenorrhea   . Chicken pox     born with chicken pox  . Shingles     age 68  . Chronic UTI     History reviewed. No pertinent past surgical history.  Current Outpatient Prescriptions  Medication Sig Dispense Refill  . Norethindrone-Ethinyl Estradiol-Fe Biphas (LO LOESTRIN FE) 1 MG-10 MCG / 10 MCG tablet Take 1 tablet by mouth daily. 3 Package 4   No current facility-administered medications for this visit.    Family History  Problem Relation Age of Onset  . Hypertension Father   . Hyperlipidemia Father   . Hypertension Maternal Grandmother   . Cancer Maternal Grandfather     prostate  . Hypertension Paternal Grandmother   . Hypertension Paternal Grandfather     ROS:  Pertinent  items are noted in HPI.  Otherwise, a comprehensive ROS was negative.  Exam:   BP 100/68 mmHg  Pulse 68  Resp 16  Ht 5' 8.25" (1.734 m)  Wt 137 lb (62.143 kg)  BMI 20.67 kg/m2 Height: 5' 8.25" (173.4 cm) Ht Readings from Last 3 Encounters:  03/09/16 5' 8.25" (1.734 m)  04/29/15 5' 8.25" (1.734 m)  07/24/14 5' 8.25" (1.734 m) (94 %*, Z = 1.55)   * Growth percentiles are based on CDC 2-20 Years data.    General appearance: alert, cooperative and appears stated age Head: Normocephalic, without obvious abnormality, atraumatic Neck: no adenopathy, supple, symmetrical, trachea midline and thyroid normal to inspection and palpation Lungs: clear to auscultation bilaterally CVAT: bilateral  Non tender Breasts: normal appearance, no masses or tenderness, No nipple retraction or dimpling, No nipple discharge or bleeding, No axillary or supraclavicular adenopathy Heart: regular rate and rhythm Abdomen: soft, non-tender; no masses,  no organomegaly, negative suprapubic Extremities: extremities normal, atraumatic, no cyanosis or edema Skin: Skin color, texture, turgor normal. No rashes or lesions, warm and dry Lymph nodes: Cervical, supraclavicular, and axillary nodes normal. No abnormal inguinal nodes palpated Neurologic: Grossly normal   Pelvic: External genitalia:  no lesions,  Normal female              Urethra:  normal appearing urethra with no masses, tenderness or lesions  Bladder non tender, urethral meatus slightly red, non tender              Bartholin's and Skene's: normal                 Vagina: normal appearing vagina with normal color and discharge, no lesions              Cervix: no cervical motion tenderness, no lesions and nulliparous appearance              Pap taken: Yes.   Bimanual Exam:  Uterus:  normal size, contour, position, consistency, mobility, non-tender and anteverted              Adnexa: normal adnexa and no mass, fullness, tenderness                Rectovaginal: Confirms               Anus:  normal appearance  Chaperone present: yes  A:  Well Woman with normal exam  Contraception OCP desired  Asymptomatic bacteruria, positive nitrite, history of UTI's  STD screening  Sprained left ankle under follow up with MD  P:   Reviewed health and wellness pertinent to exam  Rx Loloestrin fe see order with instructions  Discussed finding and need for treatment. Patient agreeable.reviewed warning signs and need to advise if occurs. Increase water intake and decrease soda, coffee and tea use. Decrease thong underwear use to help prevent UTI.  Lab: Urine micro, culture  Lab: GC,Chlamydia, Affirm, HIV,RPR  Pap smear as above with HPV reflex  Continue follow up with MD as indicated.   counseled on breast self exam, STD prevention, HIV risk factors and prevention, use and side effects of OCP's, adequate intake of calcium and vitamin D, diet and exercise  return annually or prn  An After Visit Summary was printed and given to the patient.

## 2016-03-09 NOTE — Patient Instructions (Signed)
General topics  Next pap or exam is  due in 1 year Take a Women's multivitamin Take 1200 mg. of calcium daily - prefer dietary If any concerns in interim to call back  Breast Self-Awareness Practicing breast self-awareness may pick up problems early, prevent significant medical complications, and possibly save your life. By practicing breast self-awareness, you can become familiar with how your breasts look and feel and if your breasts are changing. This allows you to notice changes early. It can also offer you some reassurance that your breast health is good. One way to learn what is normal for your breasts and whether your breasts are changing is to do a breast self-exam. If you find a lump or something that was not present in the past, it is best to contact your caregiver right away. Other findings that should be evaluated by your caregiver include nipple discharge, especially if it is bloody; skin changes or reddening; areas where the skin seems to be pulled in (retracted); or new lumps and bumps. Breast pain is seldom associated with cancer (malignancy), but should also be evaluated by a caregiver. BREAST SELF-EXAM The best time to examine your breasts is 5 7 days after your menstrual period is over.  ExitCare Patient Information 2013 ExitCare, LLC.   Exercise to Stay Healthy Exercise helps you become and stay healthy. EXERCISE IDEAS AND TIPS Choose exercises that:  You enjoy.  Fit into your day. You do not need to exercise really hard to be healthy. You can do exercises at a slow or medium level and stay healthy. You can:  Stretch before and after working out.  Try yoga, Pilates, or tai chi.  Lift weights.  Walk fast, swim, jog, run, climb stairs, bicycle, dance, or rollerskate.  Take aerobic classes. Exercises that burn about 150 calories:  Running 1  miles in 15 minutes.  Playing volleyball for 45 to 60 minutes.  Washing and waxing a car for 45 to 60  minutes.  Playing touch football for 45 minutes.  Walking 1  miles in 35 minutes.  Pushing a stroller 1  miles in 30 minutes.  Playing basketball for 30 minutes.  Raking leaves for 30 minutes.  Bicycling 5 miles in 30 minutes.  Walking 2 miles in 30 minutes.  Dancing for 30 minutes.  Shoveling snow for 15 minutes.  Swimming laps for 20 minutes.  Walking up stairs for 15 minutes.  Bicycling 4 miles in 15 minutes.  Gardening for 30 to 45 minutes.  Jumping rope for 15 minutes.  Washing windows or floors for 45 to 60 minutes. Document Released: 11/11/2010 Document Revised: 01/01/2012 Document Reviewed: 11/11/2010 ExitCare Patient Information 2013 ExitCare[employer] LLC.   Other topics ( that may be useful information):    Sexually Transmitted Disease Sexually transmitted disease (STD) refers to any infection that is passed from person to person during sexual activity. This may happen by way of saliva, semen, blood, vaginal mucus, or urine. Common STDs include:  Gonorrhea.  Chlamydia.  Syphilis.  HIV/AIDS.  Genital herpes.  Hepatitis B and C.  Trichomonas.  Human papillomavirus (HPV).  Pubic lice. CAUSES  An STD may be spread by bacteria, virus, or parasite. A person can get an STD by:  Sexual intercourse with an infected person.  Sharing sex toys with an infected person.  Sharing needles with an infected person.  Having intimate contact with the genitals, mouth, or rectal areas of an infected person. SYMPTOMS  Some people may not have any symptoms, but   they can still pass the infection to others. Different STDs have different symptoms. Symptoms include:  Painful or bloody urination.  Pain in the pelvis, abdomen, vagina, anus, throat, or eyes.  Skin rash, itching, irritation, growths, or sores (lesions). These usually occur in the genital or anal area.  Abnormal vaginal discharge.  Penile discharge in men.  Soft, flesh-colored skin growths in the  genital or anal area.  Fever.  Pain or bleeding during sexual intercourse.  Swollen glands in the groin area.  Yellow skin and eyes (jaundice). This is seen with hepatitis. DIAGNOSIS  To make a diagnosis, your caregiver may:  Take a medical history.  Perform a physical exam.  Take a specimen (culture) to be examined.  Examine a sample of discharge under a microscope.  Perform blood test TREATMENT   Chlamydia, gonorrhea, trichomonas, and syphilis can be cured with antibiotic medicine.  Genital herpes, hepatitis, and HIV can be treated, but not cured, with prescribed medicines. The medicines will lessen the symptoms.  Genital warts from HPV can be treated with medicine or by freezing, burning (electrocautery), or surgery. Warts may come back.  HPV is a virus and cannot be cured with medicine or surgery.However, abnormal areas may be followed very closely by your caregiver and may be removed from the cervix, vagina, or vulva through office procedures or surgery. If your diagnosis is confirmed, your recent sexual partners need treatment. This is true even if they are symptom-free or have a negative culture or evaluation. They should not have sex until their caregiver says it is okay. HOME CARE INSTRUCTIONS  All sexual partners should be informed, tested, and treated for all STDs.  Take your antibiotics as directed. Finish them even if you start to feel better.  Only take over-the-counter or prescription medicines for pain, discomfort, or fever as directed by your caregiver.  Rest.  Eat a balanced diet and drink enough fluids to keep your urine clear or pale yellow.  Do not have sex until treatment is completed and you have followed up with your caregiver. STDs should be checked after treatment.  Keep all follow-up appointments, Pap tests, and blood tests as directed by your caregiver.  Only use latex condoms and water-soluble lubricants during sexual activity. Do not use  petroleum jelly or oils.  Avoid alcohol and illegal drugs.  Get vaccinated for HPV and hepatitis. If you have not received these vaccines in the past, talk to your caregiver about whether one or both might be right for you.  Avoid risky sex practices that can break the skin. The only way to avoid getting an STD is to avoid all sexual activity.Latex condoms and dental dams (for oral sex) will help lessen the risk of getting an STD, but will not completely eliminate the risk. SEEK MEDICAL CARE IF:   You have a fever.  You have any new or worsening symptoms. Document Released: 12/30/2002 Document Revised: 01/01/2012 Document Reviewed: 01/06/2011 [facility] Patient Information 2013 [place].    Domestic Abuse You are being battered or abused if someone close to you hits, pushes, or physically hurts you in any way. You also are being abused if you are forced into activities. You are being sexually abused if you are forced to have sexual contact of any kind. You are being emotionally abused if you are made to feel worthless or if you are constantly threatened. It is important to remember that help is available. No one has the right to abuse you. PREVENTION OF FURTHER  ABUSE  Learn the warning signs of danger. This varies with situations but may include: the use of alcohol, threats, isolation from friends and family, or forced sexual contact. Leave if you feel that violence is going to occur.  If you are attacked or beaten, report it to the police so the abuse is documented. You do not have to press charges. The police can protect you while you or the attackers are leaving. Get the officer's name and badge number and a copy of the report.  Find someone you can trust and tell them what is happening to you: your caregiver, a nurse, clergy member, close friend or family member. Feeling ashamed is natural, but remember that you have done nothing wrong. No one deserves abuse. Document Released:  10/06/2000 Document Revised: 01/01/2012 Document Reviewed: 12/15/2010 ExitCare Patient Information 2013 ExitCare, LLC.    How Much is Too Much Alcohol? Drinking too much alcohol can cause injury, accidents, and health problems. These types of problems can include:   Car crashes.  Falls.  Family fighting (domestic violence).  Drowning.  Fights.  Injuries.  Burns.  Damage to certain organs.  Having a baby with birth defects. ONE DRINK CAN BE TOO MUCH WHEN YOU ARE:  Working.  Pregnant or breastfeeding.  Taking medicines. Ask your doctor.  Driving or planning to drive. If you or someone you know has a drinking problem, get help from a doctor.  Document Released: 08/05/2009 Document Revised: 01/01/2012 Document Reviewed: 08/05/2009 ExitCare Patient Information 2013 ExitCare, LLC.   Smoking Hazards Smoking cigarettes is extremely bad for your health. Tobacco smoke has over 200 known poisons in it. There are over 60 chemicals in tobacco smoke that cause cancer. Some of the chemicals found in cigarette smoke include:   Cyanide.  Benzene.  Formaldehyde.  Methanol (wood alcohol).  Acetylene (fuel used in welding torches).  Ammonia. Cigarette smoke also contains the poisonous gases nitrogen oxide and carbon monoxide.  Cigarette smokers have an increased risk of many serious medical problems and Smoking causes approximately:  90% of all lung cancer deaths in men.  80% of all lung cancer deaths in women.  90% of deaths from chronic obstructive lung disease. Compared with nonsmokers, smoking increases the risk of:  Coronary heart disease by 2 to 4 times.  Stroke by 2 to 4 times.  Men developing lung cancer by 23 times.  Women developing lung cancer by 13 times.  Dying from chronic obstructive lung diseases by 12 times.  . Smoking is the most preventable cause of death and disease in our society.  WHY IS SMOKING ADDICTIVE?  Nicotine is the chemical  agent in tobacco that is capable of causing addiction or dependence.  When you smoke and inhale, nicotine is absorbed rapidly into the bloodstream through your lungs. Nicotine absorbed through the lungs is capable of creating a powerful addiction. Both inhaled and non-inhaled nicotine may be addictive.  Addiction studies of cigarettes and spit tobacco show that addiction to nicotine occurs mainly during the teen years, when young people begin using tobacco products. WHAT ARE THE BENEFITS OF QUITTING?  There are many health benefits to quitting smoking.   Likelihood of developing cancer and heart disease decreases. Health improvements are seen almost immediately.  Blood pressure, pulse rate, and breathing patterns start returning to normal soon after quitting. QUITTING SMOKING   American Lung Association - 1-800-LUNGUSA  American Cancer Society - 1-800-ACS-2345 Document Released: 11/16/2004 Document Revised: 01/01/2012 Document Reviewed: 07/21/2009 ExitCare Patient Information 2013 ExitCare,   LLC.   Stress Management Stress is a state of physical or mental tension that often results from changes in your life or normal routine. Some common causes of stress are:  Death of a loved one.  Injuries or severe illnesses.  Getting fired or changing jobs.  Moving into a new home. Other causes may be:  Sexual problems.  Business or financial losses.  Taking on a large debt.  Regular conflict with someone at home or at work.  Constant tiredness from lack of sleep. It is not just bad things that are stressful. It may be stressful to:  Win the lottery.  Get married.  Buy a new car. The amount of stress that can be easily tolerated varies from person to person. Changes generally cause stress, regardless of the types of change. Too much stress can affect your health. It may lead to physical or emotional problems. Too little stress (boredom) may also become stressful. SUGGESTIONS TO  REDUCE STRESS:  Talk things over with your family and friends. It often is helpful to share your concerns and worries. If you feel your problem is serious, you may want to get help from a professional counselor.  Consider your problems one at a time instead of lumping them all together. Trying to take care of everything at once may seem impossible. List all the things you need to do and then start with the most important one. Set a goal to accomplish 2 or 3 things each day. If you expect to do too many in a single day you will naturally fail, causing you to feel even more stressed.  Do not use alcohol or drugs to relieve stress. Although you may feel better for a short time, they do not remove the problems that caused the stress. They can also be habit forming.  Exercise regularly - at least 3 times per week. Physical exercise can help to relieve that "uptight" feeling and will relax you.  The shortest distance between despair and hope is often a good night's sleep.  Go to bed and get up on time allowing yourself time for appointments without being rushed.  Take a short "time-out" period from any stressful situation that occurs during the day. Close your eyes and take some deep breaths. Starting with the muscles in your face, tense them, hold it for a few seconds, then relax. Repeat this with the muscles in your neck, shoulders, hand, stomach, back and legs.  Take good care of yourself. Eat a balanced diet and get plenty of rest.  Schedule time for having fun. Take a break from your daily routine to relax. HOME CARE INSTRUCTIONS   Call if you feel overwhelmed by your problems and feel you can no longer manage them on your own.  Return immediately if you feel like hurting yourself or someone else. Document Released: 04/04/2001 Document Revised: 01/01/2012 Document Reviewed: 11/25/2007 [facility] Patient Information 2013 [place].  Oral Contraception Use Oral contraceptive pills (OCPs)  are medicines taken to prevent pregnancy. OCPs work by preventing the ovaries from releasing eggs. The hormones in OCPs also cause the cervical mucus to thicken, preventing the sperm from entering the uterus. The hormones also cause the uterine lining to become thin, not allowing a fertilized egg to attach to the inside of the uterus. OCPs are highly effective when taken exactly as prescribed. However, OCPs do not prevent sexually transmitted diseases (STDs). Safe sex practices, such as using condoms along with an OCP, can help prevent STDs. Before  taking OCPs, you may have a physical exam and Pap test. Your health care provider may also order blood tests if necessary. Your health care provider will make sure you are a good candidate for oral contraception. Discuss with your health care provider the possible side effects of the OCP you may be prescribed. When starting an OCP, it can take 2 to 3 months for the body to adjust to the changes in hormone levels in your body.  HOW TO TAKE ORAL CONTRACEPTIVE PILLS Your health care provider may advise you on how to start taking the first cycle of OCPs. Otherwise, you can:   Start on day 1 of your menstrual period. You will not need any backup contraceptive protection with this start time.   Start on the first Sunday after your menstrual period or the day you get your prescription. In these cases, you will need to use backup contraceptive protection for the first week.   Start the pill at any time of your cycle. If you take the pill within 5 days of the start of your period, you are protected against pregnancy right away. In this case, you will not need a backup form of birth control. If you start at any other time of your menstrual cycle, you will need to use another form of birth control for 7 days. If your OCP is the type called a minipill, it will protect you from pregnancy after taking it for 2 days (48 hours). After you have started taking OCPs:   If you  forget to take 1 pill, take it as soon as you remember. Take the next pill at the regular time.   If you miss 2 or more pills, call your health care provider because different pills have different instructions for missed doses. Use backup birth control until your next menstrual period starts.   If you use a 28-day pack that contains inactive pills and you miss 1 of the last 7 pills (pills with no hormones), it will not matter. Throw away the rest of the non-hormone pills and start a new pill pack.  No matter which day you start the OCP, you will always start a new pack on that same day of the week. Have an extra pack of OCPs and a backup contraceptive method available in case you miss some pills or lose your OCP pack.  HOME CARE INSTRUCTIONS   Do not smoke.   Always use a condom to protect against STDs. OCPs do not protect against STDs.   Use a calendar to mark your menstrual period days.   Read the information and directions that came with your OCP. Talk to your health care provider if you have questions.  SEEK MEDICAL CARE IF:   You develop nausea and vomiting.   You have abnormal vaginal discharge or bleeding.   You develop a rash.   You miss your menstrual period.   You are losing your hair.   You need treatment for mood swings or depression.   You get dizzy when taking the OCP.   You develop acne from taking the OCP.   You become pregnant.  SEEK IMMEDIATE MEDICAL CARE IF:   You develop chest pain.   You develop shortness of breath.   You have an uncontrolled or severe headache.   You develop numbness or slurred speech.   You develop visual problems.   You develop pain, redness, and swelling in the legs.    This information is not intended to  replace advice given to you by your health care provider. Make sure you discuss any questions you have with your health care provider.   Document Released: 09/28/2011 Document Revised: 10/30/2014  Document Reviewed: 03/30/2013 Elsevier Interactive Patient Education 2016 [employer]. Asymptomatic Bacteriuria, Female Asymptomatic bacteriuria is the presence of a large number of bacteria in your urine without the usual symptoms of burning or frequent urination. The following conditions increase the risk of asymptomatic bacteriuria:  Diabetes mellitus.  Advanced age.  Pregnancy in the first trimester.  Kidney stones.  Kidney transplants.  Leaky kidney tube valve in young children (reflux). Treatment for this condition is not needed in most people and can lead to other problems such as too much yeast and growth of resistant bacteria. However, some people, such as pregnant women, do need treatment to prevent kidney infection. Asymptomatic bacteriuria in pregnancy is also associated with fetal growth restriction, premature labor, and newborn death. HOME CARE INSTRUCTIONS Monitor your condition for any changes. The following actions may help to relieve any discomfort you are feeling:  Drink enough water and fluids to keep your urine clear or pale yellow. Go to the bathroom more often to keep your bladder empty.  Keep the area around your vagina and rectum clean. Wipe yourself from front to back after urinating. SEEK IMMEDIATE MEDICAL CARE IF:  You develop signs of an infection such as:  Burning with urination.  Frequency of voiding.  Back pain.  Fever.  You have blood in the urine.  You develop a fever. MAKE SURE YOU:  Understand these instructions.  Will watch your condition.  Will get help right away if you are not doing well or get worse.   This information is not intended to replace advice given to you by your health care provider. Make sure you discuss any questions you have with your health care provider.   Document Released: 10/09/2005 Document Revised: 10/30/2014 Document Reviewed: 03/31/2013 Elsevier Interactive Patient Education [employer].

## 2016-03-10 ENCOUNTER — Telehealth: Payer: Self-pay

## 2016-03-10 LAB — URINALYSIS, MICROSCOPIC ONLY
CASTS: NONE SEEN [LPF]
Crystals: NONE SEEN [HPF]
RBC / HPF: NONE SEEN RBC/HPF (ref <=2)
Yeast: NONE SEEN [HPF]

## 2016-03-10 LAB — WET PREP BY MOLECULAR PROBE
Candida species: NEGATIVE
Gardnerella vaginalis: NEGATIVE
Trichomonas vaginosis: NEGATIVE

## 2016-03-10 LAB — RPR

## 2016-03-10 LAB — HIV ANTIBODY (ROUTINE TESTING W REFLEX): HIV 1&2 Ab, 4th Generation: NONREACTIVE

## 2016-03-10 MED ORDER — NITROFURANTOIN MONOHYD MACRO 100 MG PO CAPS: 100.0000 mg | ORAL_CAPSULE | Freq: Two times a day (BID) | ORAL | Status: DC

## 2016-03-10 NOTE — Telephone Encounter (Signed)
[email or web address]

## 2016-03-10 NOTE — Progress Notes (Signed)
Reviewed personally.  M. Suzanne Jancy Sprankle, MD.  

## 2016-03-10 NOTE — Telephone Encounter (Signed)
-----   Message from Verner Choleborah S Leonard, CNM sent at 03/10/2016  8:42 AM EDT ----- Notify patient that HIV,RPR are negative, Wet prep negative for yeast, BV,Trichomonas Urine micro indicates infection with moderate bacteria present, continue medication, culture pending

## 2016-03-10 NOTE — Addendum Note (Signed)
Addended by: Verner CholLEONARD, DEBORAH S on: 03/10/2016 11:43 AM   Modules accepted: Orders, SmartSet

## 2016-03-10 NOTE — Telephone Encounter (Signed)
Patient notified of results. See lab 

## 2016-03-12 LAB — URINE CULTURE

## 2016-03-13 LAB — IPS PAP TEST WITH REFLEX TO HPV

## 2016-03-14 ENCOUNTER — Telehealth: Payer: Self-pay | Admitting: Certified Nurse Midwife

## 2016-03-14 NOTE — Telephone Encounter (Signed)
Patient calling for her recent results. She said, "Joy left me a message on my voice mail but I cannot access it."   Per Joy, route to triage for message Joy sent to triage about this patient.

## 2016-03-14 NOTE — Telephone Encounter (Signed)
Spoke with patient. Advised of message and results as seen below from [name] CNM. Patient is agreeable and verbalizes understanding. States she is feeling well and is not having any urinary symptoms. Aware she will need to complete the full course of her antibiotic. Aware of pap results. 08 recall placed. Next aex 03/13/2017 with [name] CNM.  Notes Recorded by Eliezer Bottomavina J Johnson, CMA on 03/14/2016 at 11:23 AM I left patient a message to call back. Please give patient her pap result & urine culture result also. 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 Complete medication Patient status  Routing to provider for final review. Patient agreeable to disposition. Will close encounter.

## 2016-03-15 LAB — IPS N GONORRHOEA AND CHLAMYDIA BY PCR

## 2016-04-28 ENCOUNTER — Telehealth: Payer: Self-pay

## 2016-04-28 ENCOUNTER — Other Ambulatory Visit: Payer: Self-pay | Admitting: Certified Nurse Midwife

## 2016-04-28 NOTE — Telephone Encounter (Signed)
Tried calling patient, no answer, left message to return call 

## 2016-04-28 NOTE — Telephone Encounter (Signed)
Patient is returning a call to Taylor. °

## 2016-04-28 NOTE — Telephone Encounter (Signed)
Medication refill request: LO LOESTRIN FE 1MG -10MCG Last AEX:  03/09/16 DL Next AEX: 1/61/095/22/18 Last MMG (if hormonal medication request): n/a Refill authorized: 03/09/16 #3packs w/4 refills; patient should have enough refills. Will call and verify with patient.

## 2016-04-28 NOTE — Telephone Encounter (Signed)
Spoke with patient. Patient is good with refills of birth control. Closing encounter.

## 2016-04-28 NOTE — Telephone Encounter (Signed)
Tried calling patient regarding a refill request that we received. No answer, left message to return phone call.

## 2016-05-04 ENCOUNTER — Ambulatory Visit: Payer: BLUE CROSS/BLUE SHIELD | Admitting: Certified Nurse Midwife

## 2016-08-16 ENCOUNTER — Other Ambulatory Visit: Payer: Self-pay | Admitting: Certified Nurse Midwife

## 2016-08-16 DIAGNOSIS — Z3041 Encounter for surveillance of contraceptive pills: Secondary | ICD-10-CM

## 2016-08-16 MED ORDER — NORETHIN-ETH ESTRAD-FE BIPHAS 1 MG-10 MCG / 10 MCG PO TABS: 1.0000 | ORAL_TABLET | Freq: Every day | ORAL | 2 refills | Status: DC

## 2016-08-16 NOTE — Telephone Encounter (Signed)
Medication refill request: OCP Last AEX:  03/09/16 DL Next AEX: 1/61/095/22/18 DL Last MMG (if hormonal medication request): None Refill authorized: 03/09/16 #3packs/4 Refills. To [facility].   [facility] [place] until 02/2017 as prescribed by DL.

## 2016-08-16 NOTE — Telephone Encounter (Signed)
Patient calling for a refill on the LoLoestrin. [facility] [address] in [place] at [phone]

## 2017-03-13 ENCOUNTER — Encounter: Payer: Self-pay | Admitting: Certified Nurse Midwife

## 2017-03-13 ENCOUNTER — Other Ambulatory Visit (HOSPITAL_COMMUNITY)
Admission: RE | Admit: 2017-03-13 | Discharge: 2017-03-13 | Disposition: A | Discharge status: Home / Self Care | Payer: BLUE CROSS/BLUE SHIELD | Source: Ambulatory Visit | Attending: Certified Nurse Midwife | Admitting: Certified Nurse Midwife

## 2017-03-13 ENCOUNTER — Ambulatory Visit (INDEPENDENT_AMBULATORY_CARE_PROVIDER_SITE_OTHER): Payer: BLUE CROSS/BLUE SHIELD | Admitting: Certified Nurse Midwife

## 2017-03-13 VITALS — BP 90/64 | HR 60 | Resp 16 | Ht 68.25 in | Wt 141.0 lb

## 2017-03-13 DIAGNOSIS — Z3041 Encounter for surveillance of contraceptive pills: Secondary | ICD-10-CM | POA: Diagnosis not present

## 2017-03-13 DIAGNOSIS — Z01419 Encounter for gynecological examination (general) (routine) without abnormal findings: Secondary | ICD-10-CM

## 2017-03-13 DIAGNOSIS — Z124 Encounter for screening for malignant neoplasm of cervix: Secondary | ICD-10-CM | POA: Insufficient documentation

## 2017-03-13 DIAGNOSIS — Z0000 Encounter for general adult medical examination without abnormal findings: Secondary | ICD-10-CM

## 2017-03-13 LAB — CBC
HEMATOCRIT: 42.1 % (ref 35.0–45.0)
Hemoglobin: 13.5 g/dL (ref 11.7–15.5)
MCH: 31.2 pg (ref 27.0–33.0)
MCHC: 32.1 g/dL (ref 32.0–36.0)
MCV: 97.2 fL (ref 80.0–100.0)
MPV: 9.8 fL (ref 7.5–12.5)
PLATELETS: 229 10*3/uL (ref 140–400)
RBC: 4.33 MIL/uL (ref 3.80–5.10)
RDW: 13.2 % (ref 11.0–15.0)
WBC: 5.4 10*3/uL (ref 3.8–10.8)

## 2017-03-13 MED ORDER — NORETHIN-ETH ESTRAD-FE BIPHAS 1 MG-10 MCG / 10 MCG PO TABS: 1.0000 | ORAL_TABLET | Freq: Every day | ORAL | 4 refills | Status: DC

## 2017-03-13 NOTE — Patient Instructions (Signed)
General topics  Next pap or exam is  due in 1 year Take a Women's multivitamin Take 1200 mg. of calcium daily - prefer dietary If any concerns in interim to call back  Breast Self-Awareness Practicing breast self-awareness may pick up problems early, prevent significant medical complications, and possibly save your life. By practicing breast self-awareness, you can become familiar with how your breasts look and feel and if your breasts are changing. This allows you to notice changes early. It can also offer you some reassurance that your breast health is good. One way to learn what is normal for your breasts and whether your breasts are changing is to do a breast self-exam. If you find a lump or something that was not present in the past, it is best to contact your caregiver right away. Other findings that should be evaluated by your caregiver include nipple discharge, especially if it is bloody; skin changes or reddening; areas where the skin seems to be pulled in (retracted); or new lumps and bumps. Breast pain is seldom associated with cancer (malignancy), but should also be evaluated by a caregiver. BREAST SELF-EXAM The best time to examine your breasts is 5 7 days after your menstrual period is over.  ExitCare Patient Information 2013 ExitCare[employer] LLC.   Exercise to Stay Healthy Exercise helps you become and stay healthy. EXERCISE IDEAS AND TIPS Choose exercises that:  You enjoy.  Fit into your day. You do not need to exercise really hard to be healthy. You can do exercises at a slow or medium level and stay healthy. You can:  Stretch before and after working out.  Try yoga, Pilates, or tai chi.  Lift weights.  Walk fast, swim, jog, run, climb stairs, bicycle, dance, or rollerskate.  Take aerobic classes. Exercises that burn about 150 calories:  Running 1  miles in 15 minutes.  Playing volleyball for 45 to 60 minutes.  Washing and waxing a car for 45 to 60  minutes.  Playing touch football for 45 minutes.  Walking 1  miles in 35 minutes.  Pushing a stroller 1  miles in 30 minutes.  Playing basketball for 30 minutes.  Raking leaves for 30 minutes.  Bicycling 5 miles in 30 minutes.  Walking 2 miles in 30 minutes.  Dancing for 30 minutes.  Shoveling snow for 15 minutes.  Swimming laps for 20 minutes.  Walking up stairs for 15 minutes.  Bicycling 4 miles in 15 minutes.  Gardening for 30 to 45 minutes.  Jumping rope for 15 minutes.  Washing windows or floors for 45 to 60 minutes. Document Released: 11/11/2010 Document Revised: 01/01/2012 Document Reviewed: 11/11/2010 ExitCare Patient Information 2013 ExitCare[employer].   Other topics ( that may be useful information):    Sexually Transmitted Disease Sexually transmitted disease (STD) refers to any infection that is passed from person to person during sexual activity. This may happen by way of saliva, semen, blood, vaginal mucus, or urine. Common STDs include:  Gonorrhea.  Chlamydia.  Syphilis.  HIV/AIDS.  Genital herpes.  Hepatitis B and C.  Trichomonas.  Human papillomavirus (HPV).  Pubic lice. CAUSES  An STD may be spread by bacteria, virus, or parasite. A person can get an STD by:  Sexual intercourse with an infected person.  Sharing sex toys with an infected person.  Sharing needles with an infected person.  Having intimate contact with the genitals, mouth, or rectal areas of an infected person. SYMPTOMS  Some people may not have any symptoms, but   they can still pass the infection to others. Different STDs have different symptoms. Symptoms include:  Painful or bloody urination.  Pain in the pelvis, abdomen, vagina, anus, throat, or eyes.  Skin rash, itching, irritation, growths, or sores (lesions). These usually occur in the genital or anal area.  Abnormal vaginal discharge.  Penile discharge in men.  Soft, flesh-colored skin growths in the  genital or anal area.  Fever.  Pain or bleeding during sexual intercourse.  Swollen glands in the groin area.  Yellow skin and eyes (jaundice). This is seen with hepatitis. DIAGNOSIS  To make a diagnosis, your caregiver may:  Take a medical history.  Perform a physical exam.  Take a specimen (culture) to be examined.  Examine a sample of discharge under a microscope.  Perform blood test TREATMENT   Chlamydia, gonorrhea, trichomonas, and syphilis can be cured with antibiotic medicine.  Genital herpes, hepatitis, and HIV can be treated, but not cured, with prescribed medicines. The medicines will lessen the symptoms.  Genital warts from HPV can be treated with medicine or by freezing, burning (electrocautery), or surgery. Warts may come back.  HPV is a virus and cannot be cured with medicine or surgery.However, abnormal areas may be followed very closely by your caregiver and may be removed from the cervix, vagina, or vulva through office procedures or surgery. If your diagnosis is confirmed, your recent sexual partners need treatment. This is true even if they are symptom-free or have a negative culture or evaluation. They should not have sex until their caregiver says it is okay. HOME CARE INSTRUCTIONS  All sexual partners should be informed, tested, and treated for all STDs.  Take your antibiotics as directed. Finish them even if you start to feel better.  Only take over-the-counter or prescription medicines for pain, discomfort, or fever as directed by your caregiver.  Rest.  Eat a balanced diet and drink enough fluids to keep your urine clear or pale yellow.  Do not have sex until treatment is completed and you have followed up with your caregiver. STDs should be checked after treatment.  Keep all follow-up appointments, Pap tests, and blood tests as directed by your caregiver.  Only use latex condoms and water-soluble lubricants during sexual activity. Do not use  petroleum jelly or oils.  Avoid alcohol and illegal drugs.  Get vaccinated for HPV and hepatitis. If you have not received these vaccines in the past, talk to your caregiver about whether one or both might be right for you.  Avoid risky sex practices that can break the skin. The only way to avoid getting an STD is to avoid all sexual activity.Latex condoms and dental dams (for oral sex) will help lessen the risk of getting an STD, but will not completely eliminate the risk. SEEK MEDICAL CARE IF:   You have a fever.  You have any new or worsening symptoms. Document Released: 12/30/2002 Document Revised: 01/01/2012 Document Reviewed: 01/06/2011 [facility] Patient Information 2013 [place].    Domestic Abuse You are being battered or abused if someone close to you hits, pushes, or physically hurts you in any way. You also are being abused if you are forced into activities. You are being sexually abused if you are forced to have sexual contact of any kind. You are being emotionally abused if you are made to feel worthless or if you are constantly threatened. It is important to remember that help is available. No one has the right to abuse you. PREVENTION OF FURTHER  ABUSE  Learn the warning signs of danger. This varies with situations but may include: the use of alcohol, threats, isolation from friends and family, or forced sexual contact. Leave if you feel that violence is going to occur.  If you are attacked or beaten, report it to the police so the abuse is documented. You do not have to press charges. The police can protect you while you or the attackers are leaving. Get the officer's name and badge number and a copy of the report.  Find someone you can trust and tell them what is happening to you: your caregiver, a nurse, clergy member, close friend or family member. Feeling ashamed is natural, but remember that you have done nothing wrong. No one deserves abuse. Document Released:  10/06/2000 Document Revised: 01/01/2012 Document Reviewed: 12/15/2010 ExitCare Patient Information 2013 ExitCare, LLC.    How Much is Too Much Alcohol? Drinking too much alcohol can cause injury, accidents, and health problems. These types of problems can include:   Car crashes.  Falls.  Family fighting (domestic violence).  Drowning.  Fights.  Injuries.  Burns.  Damage to certain organs.  Having a baby with birth defects. ONE DRINK CAN BE TOO MUCH WHEN YOU ARE:  Working.  Pregnant or breastfeeding.  Taking medicines. Ask your doctor.  Driving or planning to drive. If you or someone you know has a drinking problem, get help from a doctor.  Document Released: 08/05/2009 Document Revised: 01/01/2012 Document Reviewed: 08/05/2009 ExitCare Patient Information 2013 ExitCare[employer] LLC.   Smoking Hazards Smoking cigarettes is extremely bad for your health. Tobacco smoke has over 200 known poisons in it. There are over 60 chemicals in tobacco smoke that cause cancer. Some of the chemicals found in cigarette smoke include:   Cyanide.  Benzene.  Formaldehyde.  Methanol (wood alcohol).  Acetylene (fuel used in welding torches).  Ammonia. Cigarette smoke also contains the poisonous gases nitrogen oxide and carbon monoxide.  Cigarette smokers have an increased risk of many serious medical problems and Smoking causes approximately:  90% of all lung cancer deaths in men.  80% of all lung cancer deaths in women.  90% of deaths from chronic obstructive lung disease. Compared with nonsmokers, smoking increases the risk of:  Coronary heart disease by 2 to 4 times.  Stroke by 2 to 4 times.  Men developing lung cancer by 23 times.  Women developing lung cancer by 13 times.  Dying from chronic obstructive lung diseases by 12 times.  . Smoking is the most preventable cause of death and disease in our society.  WHY IS SMOKING ADDICTIVE?  Nicotine is the chemical  agent in tobacco that is capable of causing addiction or dependence.  When you smoke and inhale, nicotine is absorbed rapidly into the bloodstream through your lungs. Nicotine absorbed through the lungs is capable of creating a powerful addiction. Both inhaled and non-inhaled nicotine may be addictive.  Addiction studies of cigarettes and spit tobacco show that addiction to nicotine occurs mainly during the teen years, when young people begin using tobacco products. WHAT ARE THE BENEFITS OF QUITTING?  There are many health benefits to quitting smoking.   Likelihood of developing cancer and heart disease decreases. Health improvements are seen almost immediately.  Blood pressure, pulse rate, and breathing patterns start returning to normal soon after quitting. QUITTING SMOKING   American Lung Association - 1-800-LUNGUSA  American Cancer Society - 1-800-ACS-2345 Document Released: 11/16/2004 Document Revised: 01/01/2012 Document Reviewed: 07/21/2009 ExitCare Patient Information 2013 ExitCare,   LLC.   Stress Management Stress is a state of physical or mental tension that often results from changes in your life or normal routine. Some common causes of stress are:  Death of a loved one.  Injuries or severe illnesses.  Getting fired or changing jobs.  Moving into a new home. Other causes may be:  Sexual problems.  Business or financial losses.  Taking on a large debt.  Regular conflict with someone at home or at work.  Constant tiredness from lack of sleep. It is not just bad things that are stressful. It may be stressful to:  Win the lottery.  Get married.  Buy a new car. The amount of stress that can be easily tolerated varies from person to person. Changes generally cause stress, regardless of the types of change. Too much stress can affect your health. It may lead to physical or emotional problems. Too little stress (boredom) may also become stressful. SUGGESTIONS TO  REDUCE STRESS:  Talk things over with your family and friends. It often is helpful to share your concerns and worries. If you feel your problem is serious, you may want to get help from a professional counselor.  Consider your problems one at a time instead of lumping them all together. Trying to take care of everything at once may seem impossible. List all the things you need to do and then start with the most important one. Set a goal to accomplish 2 or 3 things each day. If you expect to do too many in a single day you will naturally fail, causing you to feel even more stressed.  Do not use alcohol or drugs to relieve stress. Although you may feel better for a short time, they do not remove the problems that caused the stress. They can also be habit forming.  Exercise regularly - at least 3 times per week. Physical exercise can help to relieve that "uptight" feeling and will relax you.  The shortest distance between despair and hope is often a good night's sleep.  Go to bed and get up on time allowing yourself time for appointments without being rushed.  Take a short "time-out" period from any stressful situation that occurs during the day. Close your eyes and take some deep breaths. Starting with the muscles in your face, tense them, hold it for a few seconds, then relax. Repeat this with the muscles in your neck, shoulders, hand, stomach, back and legs.  Take good care of yourself. Eat a balanced diet and get plenty of rest.  Schedule time for having fun. Take a break from your daily routine to relax. HOME CARE INSTRUCTIONS   Call if you feel overwhelmed by your problems and feel you can no longer manage them on your own.  Return immediately if you feel like hurting yourself or someone else. Document Released: 04/04/2001 Document Revised: 01/01/2012 Document Reviewed: 11/25/2007 [facility] Patient Information 2013 [place].   Congratulations on your graduation and your new  journey!! Debbi

## 2017-03-13 NOTE — Progress Notes (Signed)
21 y.o. G0P0000 Single  Caucasian Fe here for annual exam. Periods normal no issues. Contraception working well. Desires continuance of OCP, continues with scant to no period with OCP. Happy with choice.. Sexually active with  partner change.Desires STD screening. 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 No other health concerns. Just graduated from [employer]!  No LMP recorded. Patient is not currently having periods (Reason: Oral contraceptives).    11/23/16?     Sexually active: Yes.    The current method of family planning is OCP (estrogen/progesterone).    Exercising: Yes.    run & workouts Smoker:  no  Health Maintenance: Pap:  03-09-16 LGSIL History of Abnormal Pap: yes LGSIL 2016 MMG:  none Self Breast exams: yes Colonoscopy:  none BMD:   none TDaP:  2014 Shingles: no Pneumonia: no Hep C and HIV: HIV neg 2017 Labs: none   reports that she has never smoked. She has never used smokeless tobacco. She reports that she drinks about 1.2 - 1.8 oz of alcohol per week . She reports that she does not use drugs.  Past Medical History:  Diagnosis Date  . Abnormal uterine bleeding    frequent cycles  . Anemia   . Chicken pox    born with chicken pox  . Chronic UTI   . Dysmenorrhea   . Shingles    age 89    No past surgical history on file.  Current Outpatient Prescriptions  Medication Sig Dispense Refill  . nitrofurantoin, macrocrystal-monohydrate, (MACROBID) 100 MG capsule Take 1 capsule (100 mg total) by mouth 2 (two) times daily. 14 capsule 0  . Norethindrone-Ethinyl Estradiol-Fe Biphas (LO LOESTRIN FE) 1 MG-10 MCG / 10 MCG tablet Take 1 tablet by mouth daily. 3 Package 2   No current facility-administered medications for this visit.     Family History  Problem Relation Age of Onset  . Hypertension Father   . Hyperlipidemia Father   . Hypertension Maternal Grandmother   . Cancer Maternal Grandfather        prostate  . Hypertension Paternal Grandmother   .  Hypertension Paternal Grandfather     ROS:  Pertinent items are noted in HPI.  Otherwise, a comprehensive ROS was negative.  Exam:   There were no vitals taken for this visit.   Ht Readings from Last 3 Encounters:  03/09/16 5' 8.25" (1.734 m)  04/29/15 5' 8.25" (1.734 m)  07/24/14 5' 8.25" (1.734 m) (94 %, Z= 1.55)*   * Growth percentiles are based on CDC 2-20 Years data.    General appearance: alert, cooperative and appears stated age Head: Normocephalic, without obvious abnormality, atraumatic Neck: no adenopathy, supple, symmetrical, trachea midline and thyroid normal to inspection and palpation Lungs: clear to auscultation bilaterally Breasts: normal appearance, no masses or tenderness, No nipple retraction or dimpling, No nipple discharge or bleeding, No axillary or supraclavicular adenopathy Heart: regular rate and rhythm Abdomen: soft, non-tender; no masses,  no organomegaly Extremities: extremities normal, atraumatic, no cyanosis or edema Skin: Skin color, texture, turgor normal. No rashes or lesions Lymph nodes: Cervical, supraclavicular, and axillary nodes normal. No abnormal inguinal nodes palpated Neurologic: Grossly normal   Pelvic: External genitalia:  no lesions              Urethra:  normal appearing urethra with no masses, tenderness or lesions              Bartholin's and Skene's: normal  Vagina: normal appearing vagina with normal color and discharge, no lesions              Cervix: no bleeding following Pap, no cervical motion tenderness and no lesions              Pap taken: Yes.   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 Discussed with patient establishing care for interim needs and does not need GYN exam for one year unless issues.. Patient may decide to continue care here for aex, since family is here. She will advise. Aware of My Chart use for questions. Wished well with her new journey!  return annually or prn  An After Visit Summary was printed and given to the patient.

## 2017-03-14 LAB — WET PREP BY MOLECULAR PROBE
Candida species: NOT DETECTED
Gardnerella vaginalis: NOT DETECTED
Trichomonas vaginosis: NOT DETECTED

## 2017-03-14 LAB — HEPATITIS C ANTIBODY: HCV AB: NEGATIVE

## 2017-03-14 LAB — STD PANEL
HIV: NONREACTIVE
Hepatitis B Surface Ag: NEGATIVE

## 2017-03-20 ENCOUNTER — Telehealth: Payer: Self-pay | Admitting: *Deleted

## 2017-03-20 ENCOUNTER — Other Ambulatory Visit: Payer: Self-pay | Admitting: *Deleted

## 2017-03-20 LAB — CYTOLOGY - PAP
CHLAMYDIA, DNA PROBE: POSITIVE — AB
Diagnosis: NEGATIVE
Neisseria Gonorrhea: NEGATIVE

## 2017-03-20 MED ORDER — AZITHROMYCIN 1 G PO PACK: 1.0000 | PACK | Freq: Once | ORAL | 0 refills | Status: AC

## 2017-03-20 NOTE — Telephone Encounter (Signed)
Left message to call Noreene LarssonJill at [phone].  Notes recorded by Leda MinHamm, Briauna Gilmartin N, RN on 03/20/2017 at 2:35 PM EDT Clarified with [name], CNM, Azithromycin 1gm packet po, once. ------  Notes recorded by Verner CholLeonard, Deborah S, CNM on 03/20/2017 at 12:44 PM EDT Notify patient that pap smear is negative 02 Gonorrhea is negative Chlamydia is positive. She will need treatment with Azithromycin 2 gm packet, no order placed. She will also need to notify partner and she will need TOC in 3 months, very important.. She is moving to [place] so may need to do there, but very important follow up.

## 2017-03-20 NOTE — Telephone Encounter (Signed)
Spoke with patient, advised of results and recommendations as seen below per [name], CNM. Rx for azithromycin to verified pharmacy on file. Reviewed importance of condom use. Reviewed options for scheduling TOC as patient will be moving to [place] -pcp, urgent care, GYN, local clinic. Patient verbalizes understanding and is agreeable.   Confidential Communicable Disease report [facility], ATTNCheryln Manly: Brandy Sessoms at [phone].   Routing to provider for final review. Patient is agreeable to disposition. Will close encounter.

## 2018-03-19 ENCOUNTER — Other Ambulatory Visit: Payer: Self-pay | Admitting: Certified Nurse Midwife

## 2018-03-19 DIAGNOSIS — Z3041 Encounter for surveillance of contraceptive pills: Secondary | ICD-10-CM

## 2018-03-19 NOTE — Telephone Encounter (Signed)
Patient returned call. Patient states she has moved to [facility], but will be home 7/2-7/7 asking if she can schedule aex for then. AEX scheduled for 04/24/18 at 0800 with Leota Sauers, CNM. Patient states she should have enough OCP to make it aex appointment. If not, will call and advise.   Routing to provider for final review. Patient agreeable to disposition. Will close encounter.

## 2018-03-19 NOTE — Telephone Encounter (Signed)
Detailed message left per DPR on mobile number for patient to return call to schedule aex and to confirm what pharmacy she needs OCP refilled to.

## 2018-03-28 ENCOUNTER — Telehealth: Payer: Self-pay | Admitting: Certified Nurse Midwife

## 2018-03-28 DIAGNOSIS — Z3041 Encounter for surveillance of contraceptive pills: Secondary | ICD-10-CM

## 2018-03-28 MED ORDER — NORETHIN-ETH ESTRAD-FE BIPHAS 1 MG-10 MCG / 10 MCG PO TABS: 1.0000 | ORAL_TABLET | Freq: Every day | ORAL | 0 refills | Status: AC

## 2018-03-28 NOTE — Telephone Encounter (Signed)
Left message to call Noreene LarssonJill at [phone].   Last AEX: 03/13/17 DL Next AEX: 0/9/817/3/19 DL

## 2018-03-28 NOTE — Telephone Encounter (Signed)
Spoke with patient, advised as seen below per [name], CNM. Advised OCP #1pkg/0RF sent to pharmacy. Keep AEX for future refills. Patient verbalizes understanding and is agreeable. Will close encounter.

## 2018-03-28 NOTE — Telephone Encounter (Signed)
Recommend she restart with menses so she will nave normal cycle control

## 2018-03-28 NOTE — Telephone Encounter (Signed)
Spoke with patient. Rx for Lo Loestrin has expired, requesting refill until next AEX 04/24/18. Patient has not taken OCP since 03/25/18. LMP -no menses with OCP, not recently SA.   Advised patient will review refill and restart with [name], CNM and return call. Will likely recommend restart with menses.   Pharamcy updated [facility] [place]   [name], CNM -please review and advise.

## 2018-03-28 NOTE — Telephone Encounter (Signed)
Patient's birth control refill has expired and she would like to get it renewed.

## 2018-04-03 ENCOUNTER — Telehealth: Payer: Self-pay | Admitting: Certified Nurse Midwife

## 2018-04-24 ENCOUNTER — Ambulatory Visit: Payer: BLUE CROSS/BLUE SHIELD | Admitting: Certified Nurse Midwife

## 2020-01-05 ENCOUNTER — Encounter: Payer: Self-pay | Admitting: Certified Nurse Midwife

## 2020-01-07 ENCOUNTER — Encounter: Payer: Self-pay | Admitting: Certified Nurse Midwife

## 2021-12-06 DIAGNOSIS — Z01419 Encounter for gynecological examination (general) (routine) without abnormal findings: Secondary | ICD-10-CM

## 2021-12-07 ENCOUNTER — Ambulatory Visit: Payer: BLUE CROSS/BLUE SHIELD

## 2021-12-07 DIAGNOSIS — Z3041 Encounter for surveillance of contraceptive pills: Secondary | ICD-10-CM

## 2021-12-07 MED ORDER — NORETHIN ACE-ETH ESTRAD-FE 1-20 MG-MCG PO TABS
1 | Freq: Every day | ORAL | 3 refills | Status: AC
Start: 2021-12-07 — End: ?

## 2021-12-07 NOTE — H&P
Department of Obstetrics and Gynecology  Outpatient Gynecology History and Physical    PATIENT:  Laura Glover  MRN:  [id number]  DOB:  Jul 30, 1996  DATE OF SERVICE:  12/07/2021  PRIMARY CARE PROVIDER: No primary care provider on file.     Chief Complaint: Annual WWE    History of Present Illness: [name] is a 26 y.o. F who presents for annual well woman exam.    She has no other gynecologic complaints.    No past medical history on file.  No past surgical history on file.    Ob/Gyn History:  OB History   No obstetric history on file.     No existing history information found.  No existing history information found.  No existing history information found.  No existing history information found.  No existing history information found.  No existing history information found.  No existing history information found.  History of abnl pap smears: {Blank single:19197::''N/A'',''denies''}  Last pap smear: ***  History of STIs: {Blank single:19197::''H/o chlamydia ***'',''H/o gonorrhea ***'',''denies''}    Social History     Substance and Sexual Activity   Sexual Activity Not on file     No family history on file.  Social History     Socioeconomic History   ? Marital status: Unknown      No current outpatient medications on file.     No current facility-administered medications for this visit.        Allergies: Not on File    Review of Systems: 14 point review of system negative, unless mentioned in the HPI above. See patient intake questionnaire form for any additional ROS.      Objective:     Vitals:   There were no vitals taken for this visit.      Physical Exam:   {qnpelist:39104}    Labs:   ***    Imaging:   {QNGYNUSLIST:39105}    Assessment/Plan:   [name] is a 26 y.o. F ***    # ***    RTC {Blank single:19197::''No follow-ups on file.'',''1 week'',''2 weeks'',''1 month'',''4-6 weeks'',''3 months'',''1 year'',''PRN''}; sooner if any questions or concerns.    No orders of the defined types were placed in this encounter.      She has our contact information for any questions or concerns. She asked appropriate questions, these were answered to her satisfaction.    {Blank single:19197::''No testing performed today.'',''For any testing performed, we will contact her with abnormal results in 1-2 weeks.''}    More than 50% of the visit was spent in counseling and coordination of care. Total encounter time for face-to-face and non-face-to-face time:  {Blank single:19197::''60'',''45'',''30''} minutes    ***  minutes were spent personally by me today on this encounter which may include today's pre-visit review of the chart, time spent during the visit, and  today's time spent  after the visit  documenting and coordinating care.    If billing by time:   New Consult Established   99202: 15-29 minutes  99203: 30-44 minutes  99204: 45-59 minutes  99205: 60-74 minutes  99417: additional 15 min   19147: 40 minutes  99244: 60 minutes  99245: 80 minutes 99212: 10-19 minutes  99213: 20-29 minutes  99214: 30-39 minutes  99215: 40-54 minutes  99417: additional 15 min         Author: [name], MD [id number])  12/06/2021 9:57 PM

## 2021-12-07 NOTE — H&P
Department of Obstetrics and Gynecology  Outpatient Gynecology History and Physical    PATIENT:  Laura Glover  MRN:  [id number]  DOB:  17-Jan-1996  DATE OF SERVICE:  12/07/2021  PRIMARY CARE PROVIDER: No primary care provider on file.     Chief Complaint: Annual WWE    History of Present Illness: [name] is a 26 y.o. F who presents for annual well woman exam.    Had lower left back pain - Nov-Dec-Jan. She was treated for BV at the beginning of the year and those symptoms have finally resolved. The pain has resolved as well.     Happy w/ Junel Fe 1/20     She has no other gynecologic complaints.    [facility]    History reviewed. No pertinent past medical history.  History reviewed. No pertinent surgical history.    Ob/Gyn History:  OB History   Gravida Para Term Preterm AB Living   0 0 0 0 0 0   SAB IAB Ectopic Multiple Live Births   0 0 0 0 0   Obstetric Comments   S/p Gardasil   BCM: Junel Fe 1/20   Last Pap:    08/2020: Normal     Does bleeding occur between periods?: No    Age at first period (years):: 12    Regular periods: Start every how many days?: q28    Irregular periods: Start every how many days?:     How long do periods last (in days):: 5    Does bleeding occur after intercourse?: No    Is pain associated with periods?: No      Social History     Substance and Sexual Activity   Sexual Activity Not on file     Family History   Problem Relation Age of Onset   ? Kidney cancer Maternal Grandmother    ? Prostate cancer Maternal Grandfather      Social History     Socioeconomic History   ? Marital status: Unknown   Tobacco Use   ? Smoking status: Never     Passive exposure: Never   ? Smokeless tobacco: Never      Current Outpatient Medications   Medication Sig   ? JUNEL FE 1/20 1-20 MG-MCG tablet    ? norethindrone-ethinyl estradiol-iron (JUNEL FE 1/20) 1-20 mg-mcg tablet Take 1 tablet by mouth daily.     No current facility-administered medications for this visit.        Allergies: No Known Allergies    Review of Systems: 14 point review of system negative, unless mentioned in the HPI above. See patient intake questionnaire form for any additional ROS.      Objective:     Vitals:   BP 130/80 (BP Location: Left arm, Patient Position: Sitting, Cuff Size: Regular)  ~ Pulse (!) 108  ~ Ht 5' 5'' (1.651 m)  ~ Wt 147 lb 3.2 oz (66.8 kg)  ~ LMP 11/02/2021 (Within Days)  ~ BMI 24.50 kg/m?       Physical Exam:   General: no apparent distress, A&Ox3  HEENT: NC/AT, no thryomegaly, EOMI  Breasts: deferred  Cardiovascular: deferred  Pulmonary: deferred  Abdomen: soft, non-tender, non-distended, no rebound, no guarding, no masses palpated  Back: deferred  Pelvic:   --vulva: normal external female genitalia  --urethra: normal, no lesions and no prolapse  --vagina: normal rugae, no lesions and no abnormal discharge  --cervix: no lesions, no CMT  --uterus: deferred  --adnexae: deferred  Extremities:  no lower extremity edema, no calf tenderness      Assessment/Plan:   [name] is a 26 y.o. F here for annual WWE and BCP management.    # Annual Exam / Health care maintenance  - Pap - sent today. Discussed ASCCP guidelines.  - Gardasil - Patient states she has already completed this vaccine series.  - Discussed breast self awareness. No risk factors identified in history to warrant early screening.  - STI screening - patient declines STI screening at this time. Discussed safer sex practices.  - Contraception - Junel Fe 1/20.   - Discussed importance of healthy lifestyle in maintaining overall health. This includes maintenance of normal weight, regular exercise, a healthy diet, alcohol consumption in moderation, tobacco avoidance, and avoiding excess sun exposure, as well as regular visits with a primary care physician. Discussed consideration of screening for skin cancer with dermatology.      RTC 1 year; sooner if any questions or concerns.    Orders Placed This Encounter   ? Pap Smear   ? Pap Smear   ? JUNEL FE 1/20 1-20 MG-MCG tablet   ? norethindrone-ethinyl estradiol-iron (JUNEL FE 1/20) 1-20 mg-mcg tablet       She has our contact information for any questions or concerns. She asked appropriate questions, these were answered to her satisfaction.    For any testing performed, we will contact her with abnormal results in 1-2 weeks.    More than 50% of the visit was spent in counseling and coordination of care. Total encounter time for face-to-face and non-face-to-face time:  30 minutes          Author: [name], MD ([id number])  12/07/2021 5:49 PM

## 2021-12-22 LAB — Liquid-based pap smear

## 2022-01-04 ENCOUNTER — Ambulatory Visit: Payer: BLUE CROSS/BLUE SHIELD

## 2022-01-10 ENCOUNTER — Ambulatory Visit: Payer: BLUE CROSS/BLUE SHIELD

## 2022-01-10 DIAGNOSIS — N760 Acute vaginitis: Secondary | ICD-10-CM

## 2022-01-10 DIAGNOSIS — B9689 Other specified bacterial agents as the cause of diseases classified elsewhere: Secondary | ICD-10-CM

## 2022-01-10 MED ORDER — FLUCONAZOLE 150 MG PO TABS
150 mg | ORAL_TABLET | Freq: Once | ORAL | 0 refills | 21.00000 days | Status: AC
Start: 2022-01-10 — End: ?

## 2022-01-10 MED ORDER — METRONIDAZOLE 500 MG PO TABS
500 mg | ORAL_TABLET | Freq: Two times a day (BID) | ORAL | 0 refills | Status: AC
Start: 2022-01-10 — End: ?

## 2022-03-27 ENCOUNTER — Ambulatory Visit: Payer: BLUE CROSS/BLUE SHIELD

## 2022-05-16 ENCOUNTER — Ambulatory Visit: Payer: BLUE CROSS/BLUE SHIELD

## 2022-05-16 MED ORDER — NITROFURANTOIN MONOHYD MACRO 100 MG PO CAPS
100 mg | ORAL_CAPSULE | Freq: Two times a day (BID) | ORAL | 0 refills | Status: AC
Start: 2022-05-16 — End: ?

## 2022-05-16 MED ORDER — FLUCONAZOLE 150 MG PO TABS
150 mg | ORAL_TABLET | Freq: Once | ORAL | 0 refills | Status: AC
Start: 2022-05-16 — End: ?

## 2022-08-13 NOTE — H&P
Outpatient Clinic Note  [address] [facility]   [address] [place] [address]    PATIENT: Laura Glover  MRN: [id number]  DOB: [birth year]  DATE OF SERVICE: 08/14/2022    PRIMARY CARE PROVIDER: Conni Elliot., MD    CHIEF COMPLAINT:   Chief Complaint   Patient presents with   ? Annual Exam     Not fasting     ? Flu Vaccine       Subjective:     Tyara Dassow is a 26 y.o. female presenting for annual and question about thyroid issues.    Other concerns today:     # ?Thyroid issues  - x 1 year   - gained weight without explanation  - was 145lbs or so around 27/2023   - bloated  - joint and muscle pain  - more irregular periods  - brain fog  - hair and skin dry and falling out  - sometimes loss of appetite  - on OCP since ~26yo    Current Outpatient Medications:   ?  norethindrone-ethinyl estradiol-iron (JUNEL FE 1/20) 1-20 mg-mcg tablet, Take 1 tablet by mouth daily., Disp: 84 each, Rfl: 3     No past medical history on file.   Family History     Relation Status Problems (Age of Onset) - (Comment)    Mother  Hypothyroidism    Maternal Grandmother  Kidney cancer ;  Hypothyroidism    Maternal Grandfather  Prostate cancer         No Known Allergies      History reviewed. No pertinent surgical history.     Social History     Socioeconomic History   ? Marital status: Unknown   ? Highest education level: Bachelor's degree (e.g., BA, AB, BS)   Occupational History   ? Occupation: [employer]   Tobacco Use   ? Smoking status: Never     Passive exposure: Never   ? Smokeless tobacco: Never   Substance and Sexual Activity   ? Alcohol use: Yes     Comment: socially on weekend; 2 glasses of wine at dinner   ? Drug use: Never   ? Sexual activity: Yes     Partners: Male     Birth control/protection: Pill      Menstrual History:  OB History     Gravida   0    Para   0    Term   0    Preterm   0    AB   0    Living   0       SAB   0    IAB   0    Ectopic   0    Multiple   0    Live Births   0 Obstetric Comments   S/p Gardasil  BCM: Junel Fe 1/20  Last Pap:   08/2020: Normal           Patient's last menstrual period was 08/14/2022.       Preventive Health screenings (USPSTF A&B recommendations):   - BMI: Body mass index is 24.51 kg/m?.   - blood pressure: 142/87  - diet:    Coffee   Salad or sandwich   Pasta or meat and veggies; sushi   - exercise:    Running mostly   Swimming  - mood (depression and anxiety screen): stable  - vaccines:    Immunization History   Administered Date(s) Administered   ?  COVID-19, mRNA, (Moderna) 100 mcg/0.5 mL 09/07/2020   ? HPV Quadrivalent 01/21/2014   ? Tdap 05/23/2013   ? influenza vaccine IM quadrivalent (Fluarix Quad) (PF) SYR (26 months of age and older) 08/14/2022      Care Gaps     Overdue                        Never    Done   Hepatitis B Screening (Once) - today         Never    Done   Hepatitis C Screening (Once) - today         Never    Done   HIV Screening (Once) - today          Feb 18 2014   HPV Vaccines (2 - 3-dose series) - got before          Nov 02 2020   COVID-19 Vaccine(Tracks primary and booster doses, not sup/immunocomp) (2 - Moderna series) - can get at pharmacy         Never    Done   Influenza Vaccine (1) - today                     Objective:     Physical Exam:  BP 117/78  ~ Pulse 69  ~ Temp 36.9 ?C (98.4 ?F) (Tympanic)  ~ Ht 5' 7.91'' (1.725 m)  ~ Wt 160 lb 12.8 oz (72.9 kg)  ~ LMP 08/14/2022  ~ BMI 24.51 kg/m?      General: well nourished, sitting comfortably in exam room chair, appears stated age  Head: normocephalic and atraumatic   Eyes: clear conjunctiva without exudates or hemorrhage. Pupils equal and reactive, extraocular muscles intact. Eyelids normal in appearance without swelling or lesions.   Ears: normal external ears bilaterally with grossly normal hearing  Nose: nasal mucosa pink and moist. Nares patent bilaterally without swelling or drainage.   Mouth & Throat: oral mucosa pink and moist with good dentition. Tongue normal in appearance without lesions and with good symmetrical movement. No buccal nodules or lesions noted. Pharynx normal in appearance without tonsillar swelling or exudates  Neck: Appears normal.   Resp: normal work of breathing, lungs clear to auscultation   Cardiac: regular rate and rhythm, no murmurs appreciated. Capillary refill less than 3 seconds in extremities.   Abdomen: soft and non-distended. No tenderness to palpation.   Skin: warm, dry, and intact   MSK: normal bulk, strength, tone, and range of motion  Neuro: non-focal, ambulating without difficulty  Psych: appropriate mood and affect    Labs:   Results for orders placed or performed in visit on 12/07/21   Pap Smear   Result Value Ref Range    CASE REPORT       Gynecologic Cytology Report                       Case: [id number]                                Authorizing Provider:  [name]., MD         Collected:           12/07/2021 1509              Ordering Location:     [facility]  [place]   Received:            12/08/2021 0935                                     [place]                                                                       First Screen:          Phineas Semen, SCT(ASCP)                                                   Specimens:   Cy Exo/Endo Smear Sure Path-Screen, Exo/Endocervix                                                  HPV Aliquot                                                                                LMP 11/02/2021     INTERPRETATION-RESULT Negative for Intraepithelial Lesion or Malignancy.      GYN OTHER FINDINGS       Fungal organisms morphologically consistent with Candida Spp.    SPECIMEN ADEQUACY       Satisfactory for evaluation.  Endocervical cells present.    Signatures       I certify that I personally conducted a gross and/or microscopic examination(s) of the described specimen(s), and have reviewed the interpretation of this test and diagnosis(es).  I agree with the documented findings or edited the findings as necessary.    See [email or web address].pt for suggested management guidelines.  CAVEAT:  Cytologic examinations are screening tests.  There is a known false negative rate of 5-10 percent and a small false positive rate of 1-2 percent.         Assessment & Plan:     1. Routine adult health maintenance  26yoF presenting for annual. Medical history including medication list reviewed and updated in each section above. BMI and blood pressure within normal at 24.51 kg/m?Marland Kitchen and 117/78, respectively. Using OCP for contraception. Most recent pap 11/2021, normal, next due 11/2024. Screening labs ordered (att'n thyroid given constellation of symptoms described in HPI; also added testosterone levels to eval for contribution for PCOS, though less likely given absence of hirsutism or cystic acne). Flu vaccine given.   - Influenza vaccine IM;   PF - (Ambulatory Protocol - Influenza Vaccine)  - Generic Order for Office Staff  - CBC & Auto Differential; Future  - Comprehensive Metabolic Panel; Future  - Lipid Panel; Future  -  Hgb A1c; Future  - TSH with reflex FT4, FT3; Future  - HIV-1/2 Ag/Ab 4th Generation with Reflex Confirmation; Future  - HBS Antigen; Future  - HCV Antibody Screen; Future  - Testosterone, Bioavailable and Total, Includes Sex Hormone-Binding Globulin (Adult Females, Children, or Individuals on Testosterone-Suppressing Hormone Therapy) (Sendout); Future  - TBOC - Venipuncture w/Collection & Handling    Will follow up results. Otherwise, patient to return to clinic as needed and in 1 year for next annual visit.      The above plan of care, diagnosis, and any orders, and follow-up were discussed with the patient. Questions related to this recommended plan of care were answered.    Shawn Stall, MD  [facility] Staff Physician   Family Medicine   08/14/2022   4:26 PM

## 2022-08-14 ENCOUNTER — Ambulatory Visit: Payer: BLUE CROSS/BLUE SHIELD | Attending: Student in an Organized Health Care Education/Training Program

## 2022-08-14 DIAGNOSIS — Z0000 Encounter for general adult medical examination without abnormal findings: Secondary | ICD-10-CM

## 2022-08-15 LAB — Comprehensive Metabolic Panel
ALANINE AMINOTRANSFERASE: 20 U/L (ref 8–70)
CALCIUM: 10.2 mg/dL (ref 8.6–10.4)

## 2022-08-15 LAB — HBs Ag: HEPATITIS B SURFACE ANTIGEN: NONREACTIVE

## 2022-08-15 LAB — HIV-1/2 Ag/Ab 4th Generation with Reflex Confirmation: HIV-1/2 AG/AB 4TH GENERATION WITH REFLEX CONFIRMATION: NONREACTIVE

## 2022-08-15 LAB — CBC: MEAN PLATELET VOLUME: 11.1 fL (ref 9.3–13.0)

## 2022-08-15 LAB — Differential Automated: IMMATURE GRANULOCYTES%: 0.2 (ref 1.30–3.40)

## 2022-08-15 LAB — Hgb A1c: HGB A1C - HPLC: 5.1 (ref <5.7)

## 2022-08-15 LAB — Lipid Panel: NON-HDL,CHOLESTEROL,CALC: 147 mg/dL — ABNORMAL HIGH (ref >50–<130)

## 2022-08-15 LAB — TSH with reflex FT4, FT3: TSH: 1.8 u[IU]/mL (ref 0.3–4.7)

## 2022-08-15 LAB — HCV Ab Screen: HCV ANTIBODY SCREEN: NONREACTIVE

## 2022-08-17 ENCOUNTER — Ambulatory Visit: Payer: BLUE CROSS/BLUE SHIELD

## 2022-08-21 LAB — Testosterone, Bioavailable and Total, Includes Sex Hormone-Binding Globulin (Adult Females, Children, or Individuals on Testosterone-Suppressing Hormone Therapy): TESTOSTERONE LC-MS, BIOAVAILABLE: 6.9 ng/dL (ref 2.2–20.6)

## 2022-10-27 MED ORDER — JUNEL FE 1/20 1-20 MG-MCG PO TABS
1.0 | Freq: Every day | ORAL | 3 refills | Status: AC
Start: 2022-10-27 — End: ?

## 2022-12-07 ENCOUNTER — Ambulatory Visit: Payer: BLUE CROSS/BLUE SHIELD

## 2022-12-07 DIAGNOSIS — Z3041 Encounter for surveillance of contraceptive pills: Secondary | ICD-10-CM

## 2022-12-07 DIAGNOSIS — R635 Abnormal weight gain: Secondary | ICD-10-CM

## 2022-12-07 DIAGNOSIS — N926 Irregular menstruation, unspecified: Secondary | ICD-10-CM

## 2022-12-07 DIAGNOSIS — Z01419 Encounter for gynecological examination (general) (routine) without abnormal findings: Secondary | ICD-10-CM

## 2022-12-07 NOTE — H&P
Department of Obstetrics and Gynecology  Outpatient Gynecology History and Physical    PATIENT:  Laura Glover  MRN:  [id number]  DOB:  1996-03-28  DATE OF SERVICE:  12/07/2022  PRIMARY CARE PROVIDER: Conni Elliot., MD     Chief Complaint:   Chief Complaint   Patient presents with    Annual Exam       History of Present Illness: [name] is a 27 y.o. F who presents for annual WWE.    Periods have been different this past year.   They would be two weeks long sometimes or two days sometimes.   The periods can be very light or heavier.   There's been a couple of months where she would skip periods. The last period    Has gained 15lbs   She has no other gynecologic complaints.    No past medical history on file.  No past surgical history on file.    Ob/Gyn History:  OB History   Gravida Para Term Preterm AB Living   0 0 0 0 0 0   SAB IAB Ectopic Multiple Live Births   0 0 0 0 0   Obstetric Comments   S/p Gardasil   BCM: Junel Fe 1/20   Last Pap:    12/2021: Normal   08/2020: Normal     Does bleeding occur between periods?: No    Age at first period (years):: 12    Regular periods: Start every how many days?: q28    Irregular periods: Start every how many days?:     How long do periods last (in days):: 5    Does bleeding occur after intercourse?: No    Is pain associated with periods?: No        Social History     Substance and Sexual Activity   Sexual Activity Yes    Partners: Male    Birth control/protection: Pill     Family History   Problem Relation Age of Onset    Hypothyroidism Mother     Kidney cancer Maternal Grandmother     Hypothyroidism Maternal Grandmother     Prostate cancer Maternal Grandfather      Social History     Socioeconomic History    Marital status: Unknown    Highest education level: Bachelor's degree (e.g., BA, AB, BS)   Occupational History    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        Allergies: No Known Allergies    Review of Systems: 14 point review of system negative, unless mentioned in the HPI above. See patient intake questionnaire form for any additional ROS.      Objective:     Vitals:   BP 137/86  ~ Pulse 81  ~ Ht 5' 9'' (1.753 m)  ~ Wt 162 lb 6.4 oz (73.7 kg)  ~  LMP 12/05/2022  ~ SpO2 99%  ~ BMI 23.98 kg/m?       Physical Exam:   {qnpelist:39104}    Labs:   ***    Imaging:   {QNGYNUSLIST:39105}    Assessment/Plan:   [name] is a 27 y.o. F ***    # ***    RTC {Blank single:19197::''No follow-ups on file.'',''1 week'',''2 weeks'',''1 month'',''4-6 weeks'',''3 months'',''1 year'',''PRN''}; sooner if any questions or concerns.    No orders of the defined types were placed in this encounter.      She has our contact information for any questions or concerns. She asked appropriate questions, these were answered to her satisfaction.    {Blank single:19197::''No testing performed today.'',''For any testing performed, we will contact her with abnormal results in 1-2 weeks.''}      ***  minutes were spent personally by me today on this encounter which may include today's pre-visit review of the chart, time spent during the visit, and  today's time spent  after the visit  documenting and coordinating care.    If billing by time:   New Consult Established   99202: 15-29 minutes  99203: 30-44 minutes  99204: 45-59 minutes  99205: 60-74 minutes  99417: additional 15 min   91478: 40 minutes  99244: 60 minutes  99245: 80 minutes 99212: 10-19 minutes  99213: 20-29 minutes  99214: 30-39 minutes  99215: 40-54 minutes  99417: additional 15 min         Author: [name], MD [phone])  12/07/2022 4:22 PM

## 2022-12-08 LAB — TSH with reflex FT4, FT3: TSH: 1.9 u[IU]/mL (ref 0.3–4.7)

## 2022-12-08 NOTE — Interdisciplinary
*.   Tourniquet applied to R LAC, area cleansed. Patient drawn from R LAC with 21G butterfly needle.   1 SST   Tourniquet removed, 2X2 applied, and coban wrapped around R LAC. Instructed patient to remove coban after 30 minutes of application. Patient verbalized understanding.Patient verified beaker labels name and DOB, blood tubes labeled. Patient discharged in stable condition, patient denied dizziness. Labs processed, logged and sent to lab.

## 2023-03-09 ENCOUNTER — Ambulatory Visit: Payer: BLUE CROSS/BLUE SHIELD

## 2023-03-09 MED ORDER — NITROFURANTOIN MONOHYD MACRO 100 MG PO CAPS
100 mg | ORAL_CAPSULE | Freq: Two times a day (BID) | ORAL | 0 refills | Status: AC
Start: 2023-03-09 — End: ?

## 2023-03-09 MED ORDER — FLUCONAZOLE 150 MG PO TABS
150 mg | ORAL_TABLET | ORAL | 0 refills | 21.00000 days | Status: AC
Start: 2023-03-09 — End: ?

## 2023-03-20 ENCOUNTER — Ambulatory Visit: Payer: BLUE CROSS/BLUE SHIELD

## 2023-03-21 ENCOUNTER — Ambulatory Visit: Payer: BLUE CROSS/BLUE SHIELD

## 2023-03-21 DIAGNOSIS — N390 Urinary tract infection, site not specified: Secondary | ICD-10-CM

## 2023-03-21 MED ORDER — SULFAMETHOXAZOLE-TRIMETHOPRIM DOUBLE STRENGTH 800-160 MG PO TABS
1 | ORAL_TABLET | Freq: Two times a day (BID) | ORAL | 0 refills | Status: AC
Start: 2023-03-21 — End: ?

## 2023-03-21 NOTE — Progress Notes
Department of Obstetrics and Gynecology  Outpatient Progress Note    PATIENT:  Laura Glover  MRN:  [id number]  DOB:  10/14/1996  DATE OF SERVICE:  03/21/2023  PRIMARY CARE PROVIDER: Conni Elliot., MD     Chief Complaint: No chief complaint on file.      History of Present Illness: Laura Glover is a 27 y.o. F who presents for recurrent UTI.    She had a lot of childhood UTI. Prior to this most recent one, she hadn't had a UTI in a while.       She has no other gynecologic complaints.    ROS: See HPI    Current Outpatient Medications   Medication Sig    fluconazole 150 mg tablet Take 1 tablet (150 mg total) by mouth every three (3) days.    [EXPIRED] nitrofurantoin, macrocrystal monohydrate, (MACROBID) 100 mg capsule Take 1 capsule (100 mg total) by mouth two (2) times daily for 7 days.    norethindrone-ethinyl estradiol-iron (JUNEL FE 1/20) 1-20 mg-mcg tablet TAKE 1 TABLET BY MOUTH DAILY     No current facility-administered medications for this visit.      No Known Allergies      Objective:     Vitals:   BP 121/82  ~ Pulse 74  ~ Ht 5' 9'' (1.753 m)  ~ Wt 163 lb 3.2 oz (74 kg)  ~ LMP 02/19/2023 (Approximate)  ~ SpO2 99%  ~ BMI 24.10 kg/m?     Physical Exam:  General: no apparent distress, A&Ox3  Pelvic:  --vulva: deferred  --urethra: deferred  --vagina: deferred  --cervix: deferred  --uterus: deferred  --adnexae: deferred  Extremities: no lower extremity edema, no calf tenderness        Labs:   Latest Reference Range & Units 03/21/23 11:01   pH <5.0, 5.0, 5.5, 6.0, 6.5, 7.0, 7.5, 8.0, 8.5  6.5   Specific Gravity <1.005, 1.005, 1.010, 1.015, 1.020, 1.025, 1.030  1.030   Blood Negative  Large (+++) !   Glucose Negative  Negative   Bilirubin Negative  Negative   Ketone Negative  Negative   Protein Negative  100 mg/dL (++) !   Urobilinogen 0.2 mg/dL, 1.0 mg/dL  0.2 mg/dL   Nitrite Negative  Positive !   Leukocyte Negative  Moderate (++) !   !: Data is abnormal      Assessment/Plan:   Laura Glover is a 27 y.o. F who presents for UTI symptoms recurrent despite recent treatment.  -- UA w/ +Nitrites and Les  -- will go ahead and rx Bactrim given symptoms and +UA  -- will f/u Ucx  -- discussed prevention strategies    RTC PRN; sooner if any questions or concerns.    Orders Placed This Encounter    Bacterial Culture Urine, CLINIC COLLECT TODAY    POCT urinalysis dipstick    sulfamethoxazole-trimethoprim DOUBLE strength 800-160 mg tablet       She has our contact information for any questions or concerns.    She asked appropriate questions, these were answered to her satisfaction.    For any testing performed, we will contact her with abnormal results in 1-2 weeks. She was given instructions to call with any questions or concerns sooner or if she does not hear from [place] in the time frame discussed.    25  minutes were spent personally by me today on this encounter which may include today's pre-visit review of the chart, time spent during the visit, and  today's time spent  after the visit  documenting and coordinating care.          Author: [name], MD 03/21/2023 11:14 AM

## 2023-06-03 NOTE — Progress Notes
Outpatient Clinic Note  [address] Family Medicine   [address] [place] [address]    PATIENT: Laura Glover  MRN: [id number]  DOB: 05/04/1996  DATE OF SERVICE: 06/04/2023    Primary Care Provider: Conni Elliot., MD     CHIEF COMPLAINT: rectal bleeding    Subjective:     Laura Glover is a 27 y.o. female presenting for: ''Concerns with digestion and rectal bleeding''.    # Rectal bleeding  - travelled a lot last month; thinks probably dehydrated  - BM pattern?    - when home, takes greens every morning and is very regular   - when travelling, was going every few days   - diet?    - doesn't typically do breakfast   - small sandwich or salad   - fruits, veggies   - hydration? Throughout day   - exercise? Regular  - tried? Laxatives, which helped   - melena? no  - lightheadedness/dizziness? no  - unintentional weight loss? No  - change in caliber of stools? no  - family history? No first degree relatives with colon cancer     Chart review prior to visit:    Visit 07/2022  Assessment & Plan:      1. Routine adult health maintenance  26yoF presenting for annual. Medical history including medication list reviewed and updated in each section above. BMI and blood pressure within normal at 24.51 kg/m?Marland Kitchen and 117/78, respectively. Using OCP for contraception. Most recent pap 11/2021, normal, next due 11/2024. Screening labs ordered (att'n thyroid given constellation of symptoms described in HPI; also added testosterone levels to eval for contribution for PCOS, though less likely given absence of hirsutism or cystic acne). Flu vaccine given.   - Influenza vaccine IM;   PF - (Ambulatory Protocol - Influenza Vaccine)  - Generic Order for Office Staff  - CBC & Auto Differential; Future  - Comprehensive Metabolic Panel; Future  - Lipid Panel; Future  - Hgb A1c; Future  - TSH with reflex FT4, FT3; Future  - HIV-1/2 Ag/Ab 4th Generation with Reflex Confirmation; Future  - HBS Antigen; Future  - HCV Antibody Screen; Future  - Testosterone, Bioavailable and Total, Includes Sex Hormone-Binding Globulin (Adult Females, Children, or Individuals on Testosterone-Suppressing Hormone Therapy) (Sendout); Future  - TBOC - Venipuncture w/Collection & Handling         Review of current medications:    Current Outpatient Medications:     norethindrone-ethinyl estradiol-iron (JUNEL FE 1/20) 1-20 mg-mcg tablet, TAKE 1 TABLET BY MOUTH DAILY, Disp: 84 each, Rfl: 3     Social History     Socioeconomic History    Marital status: Unknown    Highest education level: Bachelor's degree (e.g., BA, AB, BS)   Occupational History    Occupation: [employer]   Tobacco Use    Smoking status: Never     Passive exposure: Never    Smokeless tobacco: Never   Substance and Sexual Activity    Alcohol use: Yes     Comment: socially on weekend; 2 glasses of wine at dinner    Drug use: Never    Sexual activity: Yes     Partners: Male     Birth control/protection: Pill        Objective:     Physical Exam:   There were no vitals filed for this visit. Patient left prior to vitals being taken.     General: sitting comfortably in exam room chair  Resp:  normal work of breathing   Cardiac: regular rate  Psych: appropriate mood and affect      Assessment & Plan:     1. Weight gain  Obtaining several hormonal labs and referring to Endo for further evaluation; patient agreeable.  - Referral to Endocrinology  - Testosterone, Bioavailable and Total, Includes Sex Hormone-Binding Globulin (Adult Females, Children, or Individuals on Testosterone-Suppressing Hormone Therapy) (Sendout); Future  - DHEA-Sulfate; Future  - Prolactin; Future  - 17-Alpha-OH Progesterone; Future  - Lipid Panel; Future    2. History of rectal bleeding  3. Constipation, unspecified constipation type  Patient reports h/o minimal BRBPR in setting of travel and constipation. No red flags. RTC for recurrence or any concerns.       The above plan of care, diagnosis, orders, and follow-up were discussed with the patient. Questions related to this recommended plan of care were answered.     Shawn Stall, MD  [facility] Staff Physician   Family Medicine  06/04/2023   4:21 PM appears normal  Resp: normal work of breathing, lungs clear to auscultation   Cardiac: regular rate and rhythm, no murmurs appreciated  Abdomen: soft and non-distended.   Skin: warm, dry, and intact   MSK: grossly normal strength, tone, and range of motion  Neuro: grossly non-focal, ambulating without difficulty  Psych: appropriate mood and affect    Assessment & Plan:         Follow up in *** for ***.    The above plan of care, diagnosis, orders, and follow-up were discussed with the patient. Questions related to this recommended plan of care were answered.     Shawn Stall, MD  [facility] Staff Physician   Family Medicine  06/03/2023   9:53 AM

## 2023-06-04 ENCOUNTER — Ambulatory Visit: Payer: BLUE CROSS/BLUE SHIELD | Attending: Student in an Organized Health Care Education/Training Program

## 2023-06-04 DIAGNOSIS — K5900 Constipation, unspecified: Secondary | ICD-10-CM

## 2023-06-04 DIAGNOSIS — Z8719 Personal history of other diseases of the digestive system: Secondary | ICD-10-CM

## 2023-06-04 DIAGNOSIS — R635 Abnormal weight gain: Secondary | ICD-10-CM

## 2023-06-04 NOTE — Addendum Note
Addended byDeneise Lever on: 06/04/2023 04:28 PM     Modules accepted: Orders

## 2023-06-08 ENCOUNTER — Ambulatory Visit: Payer: BLUE CROSS/BLUE SHIELD

## 2023-08-08 MED ORDER — LARIN FE 1/20 1-20 MG-MCG PO TABS
1 | Freq: Every day | ORAL | 3 refills | Status: AC
Start: 2023-08-08 — End: ?

## 2023-08-10 MED ORDER — LARIN FE 1/20 1-20 MG-MCG PO TABS
1 | Freq: Every day | ORAL | 3 refills | Status: AC
Start: 2023-08-10 — End: ?

## 2023-12-24 ENCOUNTER — Other Ambulatory Visit: Payer: BLUE CROSS/BLUE SHIELD

## 2023-12-24 ENCOUNTER — Telehealth: Payer: BLUE CROSS/BLUE SHIELD

## 2023-12-24 NOTE — Telephone Encounter
 Spoke to pt to assist in scheduling [employer], offered pt first available pt accepted and has been scheduled for 01/24/24 @3pm  with Dr.Nguyen

## 2024-01-08 ENCOUNTER — Other Ambulatory Visit: Payer: BLUE CROSS/BLUE SHIELD

## 2024-01-09 DIAGNOSIS — B3731 Candidiasis of vulva and vagina: Secondary | ICD-10-CM

## 2024-01-09 MED ORDER — FLUCONAZOLE 150 MG PO TABS
150 mg | ORAL_TABLET | ORAL | 0 refills | Status: AC
Start: 2024-01-09 — End: ?

## 2024-01-09 MED ORDER — NYSTATIN-TRIAMCINOLONE 100000-0.1 UNIT/GM-% EX OINT
Freq: Two times a day (BID) | TOPICAL | 0 refills | Status: AC
Start: 2024-01-09 — End: ?

## 2024-01-09 NOTE — Progress Notes
 I spent a total of 5 minutes with this established patient cumulatively within the 7 days of the MyChart message date.   Topics of my discussion, including but not limited to evaluation/assessment/management of the patient complaint are noted in the MyChart message exchange(s).    Total time spent may include providing evaluation and advice, responding to the patient, documenting the encounter, placing orders, and reviewing relevant labs and/or prior notes, if applicable.     1. Candidiasis of vulva and vagina        Orders Placed This Encounter    fluconazole 150 mg tablet    nystatin-triamcinolone 100000-0.1 unit/g-% ointment       Laura Glover  01/09/2024

## 2024-01-24 ENCOUNTER — Ambulatory Visit: Payer: BLUE CROSS/BLUE SHIELD

## 2024-01-24 DIAGNOSIS — Z01419 Encounter for gynecological examination (general) (routine) without abnormal findings: Secondary | ICD-10-CM

## 2024-01-24 NOTE — H&P
 Department of Obstetrics and Gynecology  Outpatient Gynecology History and Physical    PATIENT:  Laura Glover  MRN:  [id number]  DOB:  21-Mar-1996  DATE OF SERVICE:  01/24/2024  PRIMARY CARE PROVIDER: Conni Elliot., MD     Chief Complaint:   Chief Complaint   Patient presents with    Annual Exam       History of Present Illness: Laura Glover is a 28 y.o. F who presents for WWE.    Visit from 12/07/2022:  ''Periods have been different this past year.   They would be two weeks long sometimes or two days sometimes.   The periods can be very light or heavier.   There's been a couple of months where she would skip periods. The last period   Has gained 15lbs ''    Today:   Treated for yeast infection on 01/09/24 after taking fluconazole.   Taking probiotics which has helped with her GI system.   Working out a lot more now.     Periods were regular up until this last month. Same birth control.     Declines STD screening. No changes to family hx.    She has no other gynecologic complaints.    No past medical history on file.  No past surgical history on file.    Ob/Gyn History:  OB History   Gravida Para Term Preterm AB Living   0 0 0 0 0 0   SAB IAB Ectopic Multiple Live Births   0 0 0 0 0   Obstetric Comments   S/p Gardasil   BCM: Junel Fe 1/20   Last Pap:    12/2021: Normal   08/2020: Normal     Does bleeding occur between periods?: No    Age at first period (years):: 12    Regular periods: Start every how many days?: q28    Irregular periods: Start every how many days?:     How long do periods last (in days):: 5    Does bleeding occur after intercourse?: No    Is pain associated with periods?: No        Social History     Substance and Sexual Activity   Sexual Activity Yes    Partners: Male    Birth control/protection: Pill     Family History   Problem Relation Age of Onset    Hypothyroidism Mother     Kidney cancer Maternal Grandmother     Hypothyroidism Maternal Grandmother     Prostate cancer Maternal Grandfather Social History     Socioeconomic History    Marital status: Unknown    Highest education level: Bachelor's degree (e.g., BA, AB, BS)   Occupational History    Occupation: [employer] agency   Tobacco Use    Smoking status: Never     Passive exposure: Never    Smokeless tobacco: Never   Substance and Sexual Activity    Alcohol use: Yes     Comment: socially on weekend; 2 glasses of wine at dinner    Drug use: Never    Sexual activity: Yes     Partners: Male     Birth control/protection: Pill      Current Outpatient Medications   Medication Sig    fluconazole 150 mg tablet Take 1 tablet (150 mg total) by mouth every three (3) days. May repeat once more if no improvement after 3 days    norethindrone-ethinyl estradiol-iron Elmarie Shiley FE 1/20) 1-20 mg-mcg tablet take 1  tablet by mouth daily    nystatin-triamcinolone 100000-0.1 unit/g-% ointment Apply topically two (2) times daily.     No current facility-administered medications for this visit.        Allergies: No Known Allergies    Review of Systems: 14 point review of system negative, unless mentioned in the HPI above. See patient intake questionnaire form for any additional ROS.      Objective:     Vitals:   BP 131/87  ~ Pulse 76  ~ Ht 5' 9'' (1.753 m)  ~ Wt 162 lb 9.6 oz (73.8 kg)  ~ LMP 01/10/2024 (Approximate)  ~ BMI 24.01 kg/m?     I performed the referenced procedure/examination in the presence of a chaperone, Elsy.      Physical Exam:   General: no apparent distress, A&Ox3  HEENT: NC/AT, no thryomegaly, EOMI  Breasts: symmetric, no masses, skin changes, erythema, or nipple discharge bilaterally  Cardiovascular: deferred  Pulmonary: deferred  Abdomen: deferred  Back: deferred  Pelvic:   --vulva: deferred  --urethra: deferred  --vagina: deferred  --cervix: deferred  --uterus: deferred  --adnexae: deferred  Extremities: no lower extremity edema, no calf tenderness    Labs:    06/04/23 17:12   Cholesterol 177   Cholesterol, HDL 58   Cholesterol,LDL,Calc 95 Non-HDL,Chol,Calc 119   Triglycerides 120   Testosterone, LC-MS/MS 25   Testosterone LC-MS, Bioavailable 3.9   Testosterone, Free LC-MS/MS 1.5   DHEA-Sulfate 2,110   17-Alpha-OH Progesterone 5.97   Prolactin,serum 6.72   Sex Hormone Binding Globulin 143 (H)   (H): Data is abnormally high   Latest Reference Range & Units 08/14/22 16:19 12/07/22 16:33   TSH 0.3 - 4.7 mcIU/mL 1.8 1.9     Imaging:       Assessment/Plan:   Laura Glover is a 28 y.o. F here today for WWE.     # Annual Exam / Health care maintenance  - Pap - up to date. Next due 2026 . Discussed ASCCP guidelines.  - Gardasil - Patient states she has already completed this vaccine series.  - Discussed breast self awareness. No risk factors identified in history to warrant early screening.  - STI screening - patient declines STI screening at this time. Discussed safer sex practices.  - Contraception - happy w/ current OCPs - Junel Fe 1/20.   - Discussed importance of healthy lifestyle in maintaining overall health. This includes maintenance of normal weight, regular exercise, a healthy diet, alcohol consumption in moderation, tobacco avoidance, and avoiding excess sun exposure, as well as regular visits with a primary care physician. Discussed consideration of screening for skin cancer with dermatology.      RTC 1 year; sooner if any questions or concerns.    No orders of the defined types were placed in this encounter.      She has our contact information for any questions or concerns. She asked appropriate questions, these were answered to her satisfaction.    No testing performed today.      20  minutes were spent personally by me today on this encounter which may include today's pre-visit review of the chart, time spent during the visit, and  today's time spent  after the visit  documenting and coordinating care.      Author: [name], MD ([id number])  01/24/2024 3:06 PM

## 2024-05-22 MED ORDER — LARIN FE 1/20 1-20 MG-MCG PO TABS
1 | Freq: Every day | ORAL | 3 refills
Start: 2024-05-22 — End: ?

## 2024-05-23 MED ORDER — LARIN FE 1/20 1-20 MG-MCG PO TABS
1 | Freq: Every day | ORAL | 3 refills | 84.00000 days | Status: AC
Start: 2024-05-23 — End: ?

## 2024-07-21 ENCOUNTER — Other Ambulatory Visit: Payer: BLUE CROSS/BLUE SHIELD

## 2024-07-21 DIAGNOSIS — Z0000 Encounter for general adult medical examination without abnormal findings: Principal | ICD-10-CM
